# Patient Record
Sex: Female | Born: 1955 | Race: White | Hispanic: No | Marital: Married | Smoking: Never smoker
Health system: Southern US, Community
[De-identification: ages and names within clinical notes are randomized; demographics above are authoritative.]

## PROBLEM LIST (undated history)

## (undated) DIAGNOSIS — K900 Celiac disease: Secondary | ICD-10-CM

## (undated) DIAGNOSIS — E079 Disorder of thyroid, unspecified: Secondary | ICD-10-CM

## (undated) DIAGNOSIS — D693 Immune thrombocytopenic purpura: Secondary | ICD-10-CM

## (undated) DIAGNOSIS — M810 Age-related osteoporosis without current pathological fracture: Secondary | ICD-10-CM

## (undated) HISTORY — PX: THYROIDECTOMY: SHX17

## (undated) HISTORY — PX: TUBAL LIGATION: SHX77

## (undated) HISTORY — DX: Celiac disease: K90.0

## (undated) HISTORY — DX: Age-related osteoporosis without current pathological fracture: M81.0

## (undated) HISTORY — PX: TONSILLECTOMY: SUR1361

## (undated) HISTORY — PX: FOOT SURGERY: SHX648

## (undated) HISTORY — DX: Immune thrombocytopenic purpura: D69.3

## (undated) HISTORY — PX: APPENDECTOMY: SHX54

## (undated) HISTORY — DX: Disorder of thyroid, unspecified: E07.9

---

## 1997-06-02 ENCOUNTER — Ambulatory Visit (HOSPITAL_COMMUNITY): Admission: RE | Admit: 1997-06-02 | Discharge: 1997-06-02 | Payer: Self-pay | Admitting: Obstetrics and Gynecology

## 1998-05-03 ENCOUNTER — Encounter: Payer: Self-pay | Admitting: Emergency Medicine

## 1998-05-03 ENCOUNTER — Inpatient Hospital Stay (HOSPITAL_COMMUNITY): Admission: EM | Admit: 1998-05-03 | Discharge: 1998-05-04 | Payer: Self-pay | Admitting: Emergency Medicine

## 1998-06-07 ENCOUNTER — Encounter: Payer: Self-pay | Admitting: Obstetrics and Gynecology

## 1998-06-07 ENCOUNTER — Ambulatory Visit (HOSPITAL_COMMUNITY): Admission: RE | Admit: 1998-06-07 | Discharge: 1998-06-07 | Payer: Self-pay | Admitting: Obstetrics and Gynecology

## 1999-07-19 ENCOUNTER — Encounter: Payer: Self-pay | Admitting: Obstetrics and Gynecology

## 1999-07-19 ENCOUNTER — Ambulatory Visit (HOSPITAL_COMMUNITY): Admission: RE | Admit: 1999-07-19 | Discharge: 1999-07-19 | Payer: Self-pay | Admitting: Obstetrics and Gynecology

## 1999-07-20 ENCOUNTER — Encounter (INDEPENDENT_AMBULATORY_CARE_PROVIDER_SITE_OTHER): Payer: Self-pay | Admitting: Specialist

## 1999-07-20 ENCOUNTER — Ambulatory Visit (HOSPITAL_COMMUNITY): Admission: RE | Admit: 1999-07-20 | Discharge: 1999-07-20 | Payer: Self-pay | Admitting: Gastroenterology

## 1999-08-04 ENCOUNTER — Other Ambulatory Visit: Admission: RE | Admit: 1999-08-04 | Discharge: 1999-08-04 | Payer: Self-pay | Admitting: Obstetrics and Gynecology

## 2000-04-24 HISTORY — PX: ABDOMINAL HYSTERECTOMY: SHX81

## 2000-08-03 ENCOUNTER — Other Ambulatory Visit: Admission: RE | Admit: 2000-08-03 | Discharge: 2000-08-03 | Payer: Self-pay | Admitting: Obstetrics and Gynecology

## 2000-10-02 ENCOUNTER — Encounter: Payer: Self-pay | Admitting: Obstetrics and Gynecology

## 2000-10-02 ENCOUNTER — Ambulatory Visit (HOSPITAL_COMMUNITY): Admission: RE | Admit: 2000-10-02 | Discharge: 2000-10-02 | Payer: Self-pay | Admitting: Obstetrics and Gynecology

## 2000-10-11 ENCOUNTER — Encounter: Payer: Self-pay | Admitting: Obstetrics and Gynecology

## 2000-10-18 ENCOUNTER — Encounter (INDEPENDENT_AMBULATORY_CARE_PROVIDER_SITE_OTHER): Payer: Self-pay | Admitting: Specialist

## 2000-10-18 ENCOUNTER — Inpatient Hospital Stay (HOSPITAL_COMMUNITY): Admission: RE | Admit: 2000-10-18 | Discharge: 2000-10-21 | Payer: Self-pay | Admitting: Obstetrics and Gynecology

## 2000-12-15 ENCOUNTER — Emergency Department (HOSPITAL_COMMUNITY): Admission: EM | Admit: 2000-12-15 | Discharge: 2000-12-15 | Payer: Self-pay | Admitting: Internal Medicine

## 2000-12-15 ENCOUNTER — Encounter: Payer: Self-pay | Admitting: Internal Medicine

## 2001-10-16 ENCOUNTER — Other Ambulatory Visit: Admission: RE | Admit: 2001-10-16 | Discharge: 2001-10-16 | Payer: Self-pay | Admitting: Obstetrics and Gynecology

## 2001-11-19 ENCOUNTER — Encounter: Payer: Self-pay | Admitting: Obstetrics and Gynecology

## 2001-11-19 ENCOUNTER — Ambulatory Visit (HOSPITAL_COMMUNITY): Admission: RE | Admit: 2001-11-19 | Discharge: 2001-11-19 | Payer: Self-pay | Admitting: Obstetrics and Gynecology

## 2002-10-06 ENCOUNTER — Encounter: Admission: RE | Admit: 2002-10-06 | Discharge: 2002-10-06 | Payer: Self-pay | Admitting: Obstetrics and Gynecology

## 2002-10-06 ENCOUNTER — Encounter: Payer: Self-pay | Admitting: Obstetrics and Gynecology

## 2003-03-02 ENCOUNTER — Other Ambulatory Visit: Admission: RE | Admit: 2003-03-02 | Discharge: 2003-03-02 | Payer: Self-pay | Admitting: Obstetrics and Gynecology

## 2004-02-12 ENCOUNTER — Encounter: Admission: RE | Admit: 2004-02-12 | Discharge: 2004-02-12 | Payer: Self-pay | Admitting: Obstetrics and Gynecology

## 2004-02-15 ENCOUNTER — Encounter: Admission: RE | Admit: 2004-02-15 | Discharge: 2004-02-15 | Payer: Self-pay | Admitting: Endocrinology

## 2004-03-14 ENCOUNTER — Other Ambulatory Visit: Admission: RE | Admit: 2004-03-14 | Discharge: 2004-03-14 | Payer: Self-pay | Admitting: Obstetrics and Gynecology

## 2004-03-19 ENCOUNTER — Encounter: Admission: RE | Admit: 2004-03-19 | Discharge: 2004-03-19 | Payer: Self-pay | Admitting: Endocrinology

## 2004-03-23 ENCOUNTER — Encounter: Admission: RE | Admit: 2004-03-23 | Discharge: 2004-03-23 | Payer: Self-pay | Admitting: Endocrinology

## 2004-05-28 ENCOUNTER — Encounter: Admission: RE | Admit: 2004-05-28 | Discharge: 2004-05-28 | Payer: Self-pay | Admitting: Orthopaedic Surgery

## 2005-03-14 ENCOUNTER — Encounter: Admission: RE | Admit: 2005-03-14 | Discharge: 2005-03-14 | Payer: Self-pay | Admitting: Obstetrics and Gynecology

## 2005-04-27 ENCOUNTER — Encounter: Admission: RE | Admit: 2005-04-27 | Discharge: 2005-04-27 | Payer: Self-pay | Admitting: Orthopaedic Surgery

## 2005-07-18 ENCOUNTER — Other Ambulatory Visit: Admission: RE | Admit: 2005-07-18 | Discharge: 2005-07-18 | Payer: Self-pay | Admitting: Obstetrics and Gynecology

## 2005-07-19 ENCOUNTER — Ambulatory Visit: Payer: Self-pay | Admitting: Internal Medicine

## 2005-07-31 LAB — CBC WITH DIFFERENTIAL/PLATELET
Eosinophils Absolute: 0 10*3/uL (ref 0.0–0.5)
LYMPH%: 26.1 % (ref 14.0–48.0)
MCV: 87.9 fL (ref 81.0–101.0)
MONO%: 9.3 % (ref 0.0–13.0)
NEUT#: 4.2 10*3/uL (ref 1.5–6.5)
Platelets: 22 10*3/uL — ABNORMAL LOW (ref 145–400)
RBC: 4.43 10*6/uL (ref 3.70–5.32)

## 2005-07-31 LAB — LACTATE DEHYDROGENASE: LDH: 125 U/L (ref 94–250)

## 2005-08-03 LAB — CBC WITH DIFFERENTIAL/PLATELET
Basophils Absolute: 0.1 10*3/uL (ref 0.0–0.1)
Eosinophils Absolute: 0 10*3/uL (ref 0.0–0.5)
HGB: 13 g/dL (ref 11.6–15.9)
MONO#: 0.6 10*3/uL (ref 0.1–0.9)
NEUT#: 6.1 10*3/uL (ref 1.5–6.5)
RDW: 13.1 % (ref 11.3–14.5)
lymph#: 2.2 10*3/uL (ref 0.9–3.3)

## 2005-08-04 LAB — COMPREHENSIVE METABOLIC PANEL
AST: 17 U/L (ref 0–37)
Albumin: 5 g/dL (ref 3.5–5.2)
BUN: 15 mg/dL (ref 6–23)
Calcium: 9.4 mg/dL (ref 8.4–10.5)
Chloride: 101 mEq/L (ref 96–112)
Glucose, Bld: 110 mg/dL — ABNORMAL HIGH (ref 70–99)
Potassium: 4 mEq/L (ref 3.5–5.3)

## 2005-08-04 LAB — PROTHROMBIN TIME
INR: 1 (ref 0.0–1.5)
Prothrombin Time: 13.1 seconds (ref 11.6–15.2)

## 2005-08-10 LAB — CBC WITH DIFFERENTIAL/PLATELET
EOS%: 0.1 % (ref 0.0–7.0)
LYMPH%: 31.7 % (ref 14.0–48.0)
MCH: 29.7 pg (ref 26.0–34.0)
MCV: 89.5 fL (ref 81.0–101.0)
MONO%: 9.6 % (ref 0.0–13.0)
Platelets: 192 10*3/uL (ref 145–400)
RBC: 4.34 10*6/uL (ref 3.70–5.32)
RDW: 12.8 % (ref 11.3–14.5)

## 2005-08-10 LAB — LACTATE DEHYDROGENASE: LDH: 128 U/L (ref 94–250)

## 2005-08-15 LAB — CBC WITH DIFFERENTIAL/PLATELET
HCT: 38.5 % (ref 34.8–46.6)
MCH: 29.7 pg (ref 26.0–34.0)
MCHC: 33.6 g/dL (ref 32.0–36.0)
MONO#: 1.1 10*3/uL — ABNORMAL HIGH (ref 0.1–0.9)
MONO%: 8.8 % (ref 0.0–13.0)
Platelets: 175 10*3/uL (ref 145–400)
RBC: 4.35 10*6/uL (ref 3.70–5.32)
WBC: 12.6 10*3/uL — ABNORMAL HIGH (ref 3.9–10.0)
lymph#: 3.2 10*3/uL (ref 0.9–3.3)

## 2005-08-17 LAB — CBC WITH DIFFERENTIAL/PLATELET
BASO%: 0.1 % (ref 0.0–2.0)
EOS%: 0 % (ref 0.0–7.0)
HCT: 38.3 % (ref 34.8–46.6)
LYMPH%: 15.4 % (ref 14.0–48.0)
MCH: 29.7 pg (ref 26.0–34.0)
MCHC: 33.8 g/dL (ref 32.0–36.0)
MCV: 87.9 fL (ref 81.0–101.0)
MONO%: 6.8 % (ref 0.0–13.0)
NEUT%: 77.7 % — ABNORMAL HIGH (ref 39.6–76.8)
Platelets: 187 10*3/uL (ref 145–400)
lymph#: 1.7 10*3/uL (ref 0.9–3.3)

## 2005-08-25 LAB — CBC WITH DIFFERENTIAL/PLATELET
EOS%: 0.1 % (ref 0.0–7.0)
Eosinophils Absolute: 0 10*3/uL (ref 0.0–0.5)
MCV: 87.2 fL (ref 81.0–101.0)
MONO%: 3.2 % (ref 0.0–13.0)
NEUT#: 6.9 10*3/uL — ABNORMAL HIGH (ref 1.5–6.5)
RBC: 4.34 10*6/uL (ref 3.70–5.32)
RDW: 11.9 % (ref 11.3–14.5)

## 2005-08-25 LAB — CHCC SMEAR

## 2005-09-06 ENCOUNTER — Ambulatory Visit: Payer: Self-pay | Admitting: Oncology

## 2005-09-08 LAB — CBC WITH DIFFERENTIAL/PLATELET
Basophils Absolute: 0 10*3/uL (ref 0.0–0.1)
Eosinophils Absolute: 0 10*3/uL (ref 0.0–0.5)
HGB: 13.4 g/dL (ref 11.6–15.9)
LYMPH%: 12 % — ABNORMAL LOW (ref 14.0–48.0)
MCV: 88.1 fL (ref 81.0–101.0)
MONO%: 2.8 % (ref 0.0–13.0)
NEUT#: 6.8 10*3/uL — ABNORMAL HIGH (ref 1.5–6.5)
Platelets: 220 10*3/uL (ref 145–400)
RDW: 13 % (ref 11.3–14.5)

## 2005-09-22 LAB — CBC WITH DIFFERENTIAL/PLATELET
BASO%: 0.6 % (ref 0.0–2.0)
EOS%: 0 % (ref 0.0–7.0)
HCT: 37.5 % (ref 34.8–46.6)
LYMPH%: 22.8 % (ref 14.0–48.0)
MCH: 29.6 pg (ref 26.0–34.0)
MCHC: 33.5 g/dL (ref 32.0–36.0)
MONO%: 6.6 % (ref 0.0–13.0)
NEUT%: 70 % (ref 39.6–76.8)
Platelets: 210 10*3/uL (ref 145–400)
RBC: 4.24 10*6/uL (ref 3.70–5.32)

## 2005-10-06 LAB — CBC WITH DIFFERENTIAL/PLATELET
BASO%: 0.5 % (ref 0.0–2.0)
EOS%: 0.4 % (ref 0.0–7.0)
HCT: 37.3 % (ref 34.8–46.6)
MCH: 29.8 pg (ref 26.0–34.0)
MCHC: 33.8 g/dL (ref 32.0–36.0)
NEUT%: 60.5 % (ref 39.6–76.8)
RBC: 4.24 10*6/uL (ref 3.70–5.32)
RDW: 14.1 % (ref 11.3–14.5)
lymph#: 1.9 10*3/uL (ref 0.9–3.3)

## 2005-10-27 ENCOUNTER — Ambulatory Visit: Payer: Self-pay | Admitting: Oncology

## 2005-10-27 LAB — T4, FREE: Free T4: 1.27 ng/dL (ref 0.89–1.80)

## 2005-10-27 LAB — CBC WITH DIFFERENTIAL/PLATELET
BASO%: 0.3 % (ref 0.0–2.0)
Eosinophils Absolute: 0 10*3/uL (ref 0.0–0.5)
LYMPH%: 29.3 % (ref 14.0–48.0)
MCHC: 34 g/dL (ref 32.0–36.0)
MCV: 88.7 fL (ref 81.0–101.0)
MONO%: 8.3 % (ref 0.0–13.0)
Platelets: 194 10*3/uL (ref 145–400)
RBC: 4.13 10*6/uL (ref 3.70–5.32)

## 2005-10-27 LAB — TSH: TSH: 1.175 u[IU]/mL (ref 0.350–5.500)

## 2005-11-17 LAB — CBC WITH DIFFERENTIAL/PLATELET
Eosinophils Absolute: 0.1 10*3/uL (ref 0.0–0.5)
HCT: 37.8 % (ref 34.8–46.6)
LYMPH%: 30.7 % (ref 14.0–48.0)
MCHC: 34.3 g/dL (ref 32.0–36.0)
MCV: 88.3 fL (ref 81.0–101.0)
MONO#: 0.5 10*3/uL (ref 0.1–0.9)
MONO%: 8.4 % (ref 0.0–13.0)
NEUT#: 3.6 10*3/uL (ref 1.5–6.5)
NEUT%: 59.2 % (ref 39.6–76.8)
Platelets: 224 10*3/uL (ref 145–400)
WBC: 6.2 10*3/uL (ref 3.9–10.0)

## 2005-12-08 ENCOUNTER — Ambulatory Visit: Payer: Self-pay | Admitting: Oncology

## 2005-12-15 LAB — CBC WITH DIFFERENTIAL/PLATELET
Basophils Absolute: 0 10*3/uL (ref 0.0–0.1)
Eosinophils Absolute: 0 10*3/uL (ref 0.0–0.5)
HGB: 12.6 g/dL (ref 11.6–15.9)
MCV: 88.8 fL (ref 81.0–101.0)
MONO%: 9 % (ref 0.0–13.0)
NEUT#: 2.9 10*3/uL (ref 1.5–6.5)
RDW: 13.4 % (ref 11.3–14.5)
lymph#: 1.4 10*3/uL (ref 0.9–3.3)

## 2006-01-12 LAB — CBC WITH DIFFERENTIAL/PLATELET
Basophils Absolute: 0 10*3/uL (ref 0.0–0.1)
Eosinophils Absolute: 0 10*3/uL (ref 0.0–0.5)
HGB: 12.4 g/dL (ref 11.6–15.9)
LYMPH%: 36.2 % (ref 14.0–48.0)
MCV: 88.7 fL (ref 81.0–101.0)
MONO%: 8.2 % (ref 0.0–13.0)
NEUT#: 2.3 10*3/uL (ref 1.5–6.5)
NEUT%: 54.2 % (ref 39.6–76.8)
Platelets: 210 10*3/uL (ref 145–400)

## 2006-02-07 ENCOUNTER — Ambulatory Visit: Payer: Self-pay | Admitting: Oncology

## 2006-03-09 LAB — CBC WITH DIFFERENTIAL/PLATELET
Basophils Absolute: 0 10*3/uL (ref 0.0–0.1)
EOS%: 1 % (ref 0.0–7.0)
HGB: 12.9 g/dL (ref 11.6–15.9)
MCH: 30.7 pg (ref 26.0–34.0)
MONO#: 0.4 10*3/uL (ref 0.1–0.9)
NEUT#: 2.8 10*3/uL (ref 1.5–6.5)
RDW: 13.2 % (ref 11.3–14.5)
WBC: 5.2 10*3/uL (ref 3.9–10.0)
lymph#: 1.9 10*3/uL (ref 0.9–3.3)

## 2006-03-16 ENCOUNTER — Encounter: Admission: RE | Admit: 2006-03-16 | Discharge: 2006-03-16 | Payer: Self-pay | Admitting: Obstetrics and Gynecology

## 2006-03-22 ENCOUNTER — Encounter: Admission: RE | Admit: 2006-03-22 | Discharge: 2006-03-22 | Payer: Self-pay | Admitting: Obstetrics and Gynecology

## 2006-04-15 ENCOUNTER — Ambulatory Visit: Payer: Self-pay | Admitting: Oncology

## 2006-04-20 LAB — CBC WITH DIFFERENTIAL/PLATELET
BASO%: 0.3 % (ref 0.0–2.0)
MCHC: 33.8 g/dL (ref 32.0–36.0)
MONO#: 0.4 10*3/uL (ref 0.1–0.9)
NEUT#: 3 10*3/uL (ref 1.5–6.5)
RBC: 4.14 10*6/uL (ref 3.70–5.32)
WBC: 5.1 10*3/uL (ref 3.9–10.0)
lymph#: 1.6 10*3/uL (ref 0.9–3.3)

## 2006-06-06 ENCOUNTER — Ambulatory Visit: Payer: Self-pay | Admitting: Oncology

## 2006-06-08 LAB — CBC WITH DIFFERENTIAL/PLATELET
Basophils Absolute: 0 10*3/uL (ref 0.0–0.1)
Eosinophils Absolute: 0 10*3/uL (ref 0.0–0.5)
HGB: 13.3 g/dL (ref 11.6–15.9)
LYMPH%: 30 % (ref 14.0–48.0)
MCV: 88.8 fL (ref 81.0–101.0)
MONO%: 8.7 % (ref 0.0–13.0)
NEUT#: 3.2 10*3/uL (ref 1.5–6.5)
NEUT%: 60.2 % (ref 39.6–76.8)
Platelets: 232 10*3/uL (ref 145–400)
RBC: 4.25 10*6/uL (ref 3.70–5.32)

## 2006-07-26 ENCOUNTER — Other Ambulatory Visit: Admission: RE | Admit: 2006-07-26 | Discharge: 2006-07-26 | Payer: Self-pay | Admitting: Obstetrics and Gynecology

## 2006-09-03 ENCOUNTER — Encounter: Admission: RE | Admit: 2006-09-03 | Discharge: 2006-09-03 | Payer: Self-pay | Admitting: Obstetrics and Gynecology

## 2006-09-05 ENCOUNTER — Ambulatory Visit: Payer: Self-pay | Admitting: Oncology

## 2006-09-07 LAB — CBC WITH DIFFERENTIAL/PLATELET
BASO%: 0.2 % (ref 0.0–2.0)
Basophils Absolute: 0 10*3/uL (ref 0.0–0.1)
EOS%: 0.9 % (ref 0.0–7.0)
Eosinophils Absolute: 0 10*3/uL (ref 0.0–0.5)
HCT: 35.8 % (ref 34.8–46.6)
HGB: 12.6 g/dL (ref 11.6–15.9)
LYMPH%: 34.1 % (ref 14.0–48.0)
MCH: 30.5 pg (ref 26.0–34.0)
MCHC: 35.1 g/dL (ref 32.0–36.0)
MCV: 86.9 fL (ref 81.0–101.0)
MONO#: 0.5 10*3/uL (ref 0.1–0.9)
MONO%: 10.9 % (ref 0.0–13.0)
NEUT#: 2.4 10*3/uL (ref 1.5–6.5)
NEUT%: 53.9 % (ref 39.6–76.8)
Platelets: 205 10*3/uL (ref 145–400)
RBC: 4.11 10*6/uL (ref 3.70–5.32)
RDW: 13.2 % (ref 11.3–14.5)
WBC: 4.5 10*3/uL (ref 3.9–10.0)
lymph#: 1.5 10*3/uL (ref 0.9–3.3)

## 2006-12-05 ENCOUNTER — Ambulatory Visit: Payer: Self-pay | Admitting: Oncology

## 2006-12-07 LAB — CBC WITH DIFFERENTIAL/PLATELET
BASO%: 0.2 % (ref 0.0–2.0)
Basophils Absolute: 0 10*3/uL (ref 0.0–0.1)
EOS%: 0.3 % (ref 0.0–7.0)
Eosinophils Absolute: 0 10*3/uL (ref 0.0–0.5)
HCT: 37 % (ref 34.8–46.6)
HGB: 12.8 g/dL (ref 11.6–15.9)
LYMPH%: 27.9 % (ref 14.0–48.0)
MCH: 30.5 pg (ref 26.0–34.0)
MCHC: 34.6 g/dL (ref 32.0–36.0)
MCV: 88.1 fL (ref 81.0–101.0)
MONO#: 0.5 10*3/uL (ref 0.1–0.9)
MONO%: 8.9 % (ref 0.0–13.0)
NEUT#: 3.4 10*3/uL (ref 1.5–6.5)
NEUT%: 62.7 % (ref 39.6–76.8)
Platelets: 228 10*3/uL (ref 145–400)
RBC: 4.2 10*6/uL (ref 3.70–5.32)
RDW: 13 % (ref 11.3–14.5)
WBC: 5.4 10*3/uL (ref 3.9–10.0)
lymph#: 1.5 10*3/uL (ref 0.9–3.3)

## 2007-01-01 ENCOUNTER — Ambulatory Visit: Payer: Self-pay | Admitting: Gastroenterology

## 2007-01-01 LAB — CONVERTED CEMR LAB
Ferritin: 11.5 ng/mL (ref 10.0–291.0)
Folate: >20 ng/mL
Iron: 120 ug/dL (ref 42–145)
Saturation Ratios: 28.9 % (ref 20.0–50.0)
Transferrin: 296.6 mg/dL (ref >=212.0)
Vitamin B-12: 511 pg/mL (ref 211–911)

## 2007-01-10 ENCOUNTER — Encounter: Payer: Self-pay | Admitting: Gastroenterology

## 2007-01-21 DIAGNOSIS — K900 Celiac disease: Secondary | ICD-10-CM | POA: Insufficient documentation

## 2007-03-05 ENCOUNTER — Ambulatory Visit: Payer: Self-pay | Admitting: Oncology

## 2007-03-07 ENCOUNTER — Ambulatory Visit: Payer: Self-pay | Admitting: Gastroenterology

## 2007-03-07 LAB — CBC WITH DIFFERENTIAL/PLATELET
BASO%: 0.3 % (ref 0.0–2.0)
HGB: 12.8 g/dL (ref 11.6–15.9)
LYMPH%: 27.6 % (ref 14.0–48.0)
MCH: 30.7 pg (ref 26.0–34.0)
MCHC: 35 g/dL (ref 32.0–36.0)
MONO#: 0.5 10*3/uL (ref 0.1–0.9)
MONO%: 9.3 % (ref 0.0–13.0)
NEUT#: 3.1 10*3/uL (ref 1.5–6.5)
NEUT%: 61.7 % (ref 39.6–76.8)
RBC: 4.16 10*6/uL (ref 3.70–5.32)

## 2007-03-18 ENCOUNTER — Encounter: Payer: Self-pay | Admitting: Gastroenterology

## 2007-03-18 ENCOUNTER — Ambulatory Visit: Payer: Self-pay | Admitting: Gastroenterology

## 2007-04-05 ENCOUNTER — Ambulatory Visit: Payer: Self-pay | Admitting: Gastroenterology

## 2007-04-08 ENCOUNTER — Encounter: Admission: RE | Admit: 2007-04-08 | Discharge: 2007-04-08 | Payer: Self-pay | Admitting: Obstetrics and Gynecology

## 2007-04-19 DIAGNOSIS — M810 Age-related osteoporosis without current pathological fracture: Secondary | ICD-10-CM | POA: Insufficient documentation

## 2007-04-19 DIAGNOSIS — D693 Immune thrombocytopenic purpura: Secondary | ICD-10-CM | POA: Insufficient documentation

## 2007-04-19 DIAGNOSIS — K449 Diaphragmatic hernia without obstruction or gangrene: Secondary | ICD-10-CM | POA: Insufficient documentation

## 2007-04-19 DIAGNOSIS — Z8679 Personal history of other diseases of the circulatory system: Secondary | ICD-10-CM | POA: Insufficient documentation

## 2007-09-06 ENCOUNTER — Other Ambulatory Visit: Admission: RE | Admit: 2007-09-06 | Discharge: 2007-09-06 | Payer: Self-pay | Admitting: Obstetrics and Gynecology

## 2007-10-10 ENCOUNTER — Ambulatory Visit: Payer: Self-pay | Admitting: Oncology

## 2007-10-14 ENCOUNTER — Encounter: Payer: Self-pay | Admitting: Gastroenterology

## 2007-10-14 LAB — CBC WITH DIFFERENTIAL/PLATELET
BASO%: 0.2 % (ref 0.0–2.0)
LYMPH%: 29.9 % (ref 14.0–48.0)
MCHC: 34.4 g/dL (ref 32.0–36.0)
MCV: 88 fL (ref 81.0–101.0)
MONO#: 0.5 10*3/uL (ref 0.1–0.9)
MONO%: 9 % (ref 0.0–13.0)
Platelets: 228 10*3/uL (ref 145–400)
RBC: 4.34 10*6/uL (ref 3.70–5.32)
RDW: 13 % (ref 11.3–14.5)
WBC: 5.7 10*3/uL (ref 3.9–10.0)

## 2008-01-15 ENCOUNTER — Encounter: Admission: RE | Admit: 2008-01-15 | Discharge: 2008-01-15 | Payer: Self-pay | Admitting: Family Medicine

## 2008-04-14 ENCOUNTER — Ambulatory Visit: Payer: Self-pay | Admitting: Oncology

## 2008-04-14 LAB — CBC WITH DIFFERENTIAL/PLATELET
EOS%: 1.8 % (ref 0.0–7.0)
Eosinophils Absolute: 0.1 10*3/uL (ref 0.0–0.5)
LYMPH%: 27.9 % (ref 14.0–48.0)
MCH: 30.7 pg (ref 26.0–34.0)
MCV: 90.1 fL (ref 81.0–101.0)
MONO%: 8.2 % (ref 0.0–13.0)
NEUT#: 3.5 10*3/uL (ref 1.5–6.5)
Platelets: 238 10*3/uL (ref 145–400)
RBC: 4.26 10*6/uL (ref 3.70–5.32)
RDW: 12.9 % (ref 11.3–14.5)

## 2008-04-21 ENCOUNTER — Encounter: Admission: RE | Admit: 2008-04-21 | Discharge: 2008-04-21 | Payer: Self-pay | Admitting: Family Medicine

## 2008-04-22 ENCOUNTER — Ambulatory Visit: Payer: Self-pay | Admitting: Obstetrics and Gynecology

## 2008-10-09 ENCOUNTER — Ambulatory Visit: Payer: Self-pay | Admitting: Oncology

## 2008-10-13 ENCOUNTER — Encounter: Payer: Self-pay | Admitting: Gastroenterology

## 2008-10-13 LAB — CBC WITH DIFFERENTIAL/PLATELET
Basophils Absolute: 0 10*3/uL (ref 0.0–0.1)
EOS%: 0.6 % (ref 0.0–7.0)
Eosinophils Absolute: 0 10*3/uL (ref 0.0–0.5)
HCT: 39.9 % (ref 34.8–46.6)
HGB: 14.1 g/dL (ref 11.6–15.9)
LYMPH%: 27.7 % (ref 14.0–49.7)
MCH: 31.5 pg (ref 25.1–34.0)
MCV: 89.3 fL (ref 79.5–101.0)
MONO%: 7.7 % (ref 0.0–14.0)
NEUT#: 3.2 10*3/uL (ref 1.5–6.5)
NEUT%: 63.7 % (ref 38.4–76.8)
Platelets: 203 10*3/uL (ref 145–400)

## 2008-12-01 ENCOUNTER — Encounter: Payer: Self-pay | Admitting: Obstetrics and Gynecology

## 2008-12-01 ENCOUNTER — Ambulatory Visit: Payer: Self-pay | Admitting: Obstetrics and Gynecology

## 2008-12-01 ENCOUNTER — Other Ambulatory Visit: Admission: RE | Admit: 2008-12-01 | Discharge: 2008-12-01 | Payer: Self-pay | Admitting: Obstetrics and Gynecology

## 2009-01-26 ENCOUNTER — Encounter: Admission: RE | Admit: 2009-01-26 | Discharge: 2009-04-19 | Payer: Self-pay | Admitting: Specialist

## 2009-04-05 ENCOUNTER — Ambulatory Visit: Payer: Self-pay | Admitting: Oncology

## 2009-04-07 LAB — CBC WITH DIFFERENTIAL/PLATELET
Basophils Absolute: 0 10*3/uL (ref 0.0–0.1)
EOS%: 0.3 % (ref 0.0–7.0)
Eosinophils Absolute: 0 10*3/uL (ref 0.0–0.5)
HCT: 39.6 % (ref 34.8–46.6)
HGB: 13.3 g/dL (ref 11.6–15.9)
MCH: 30.9 pg (ref 25.1–34.0)
NEUT#: 4.3 10*3/uL (ref 1.5–6.5)
NEUT%: 65.2 % (ref 38.4–76.8)
RDW: 13.3 % (ref 11.2–14.5)
lymph#: 1.8 10*3/uL (ref 0.9–3.3)

## 2009-05-07 ENCOUNTER — Ambulatory Visit: Payer: Self-pay | Admitting: Gastroenterology

## 2009-05-07 DIAGNOSIS — K219 Gastro-esophageal reflux disease without esophagitis: Secondary | ICD-10-CM | POA: Insufficient documentation

## 2009-05-13 ENCOUNTER — Encounter: Admission: RE | Admit: 2009-05-13 | Discharge: 2009-05-13 | Payer: Self-pay | Admitting: Obstetrics and Gynecology

## 2009-07-12 ENCOUNTER — Telehealth (INDEPENDENT_AMBULATORY_CARE_PROVIDER_SITE_OTHER): Payer: Self-pay | Admitting: *Deleted

## 2010-03-04 ENCOUNTER — Ambulatory Visit: Payer: Self-pay | Admitting: Oncology

## 2010-03-08 ENCOUNTER — Encounter: Payer: Self-pay | Admitting: Gastroenterology

## 2010-03-08 LAB — CBC WITH DIFFERENTIAL/PLATELET
BASO%: 0.1 % (ref 0.0–2.0)
Basophils Absolute: 0 10*3/uL (ref 0.0–0.1)
EOS%: 2.2 % (ref 0.0–7.0)
Eosinophils Absolute: 0.1 10*3/uL (ref 0.0–0.5)
HCT: 38.8 % (ref 34.8–46.6)
HGB: 13.3 g/dL (ref 11.6–15.9)
LYMPH%: 33.3 % (ref 14.0–49.7)
MCH: 30.9 pg (ref 25.1–34.0)
MCHC: 34.2 g/dL (ref 31.5–36.0)
MCV: 90.4 fL (ref 79.5–101.0)
MONO#: 0.4 10*3/uL (ref 0.1–0.9)
MONO%: 9.3 % (ref 0.0–14.0)
NEUT#: 2.6 10*3/uL (ref 1.5–6.5)
NEUT%: 55.1 % (ref 38.4–76.8)
Platelets: 201 10*3/uL (ref 145–400)
RBC: 4.29 10*6/uL (ref 3.70–5.45)
RDW: 13.1 % (ref 11.2–14.5)
WBC: 4.8 10*3/uL (ref 3.9–10.3)
lymph#: 1.6 10*3/uL (ref 0.9–3.3)

## 2010-04-21 ENCOUNTER — Telehealth (INDEPENDENT_AMBULATORY_CARE_PROVIDER_SITE_OTHER): Payer: Self-pay | Admitting: *Deleted

## 2010-05-19 ENCOUNTER — Encounter
Admission: RE | Admit: 2010-05-19 | Discharge: 2010-05-19 | Payer: Self-pay | Source: Home / Self Care | Attending: Gastroenterology | Admitting: Gastroenterology

## 2010-05-25 ENCOUNTER — Encounter: Payer: Self-pay | Admitting: Gastroenterology

## 2010-05-26 NOTE — Assessment & Plan Note (Signed)
Summary: stomach problems    History of Present Illness Primary GI MD: Sheryn Bison MD FACP FAGA Chief Complaint: cough, acid reflux worse when lying down, constantly clearing throat, belching is now better since watching what she eats and drinks History of Present Illness:   This patient is a 55 year old Caucasian female with ITP in remission, chronic thyroid dysfunction, and celiac disease who now has new onset acid reflux with throat clearing, coughing, hoarseness, regurgitation and burning substernal chest pain alleviated by elevation of the head of her bed and dietary adjustment after going on a strict medical and reflect regime self-imposed. She's had 2 negative endoscopies in the past. She has no dysphasia, anorexia, weight loss, or hepatobiliary complaints. She did feel rather strict gluten-free diet. She denies any history of Raynaud's phenomenon. She denies lower gastrointestinal problems. She is on Synthroid and estradiol.   GI Review of Systems    Reports acid reflux, belching, and  chest pain.      Denies abdominal pain, bloating, dysphagia with liquids, dysphagia with solids, heartburn, loss of appetite, nausea, vomiting, vomiting blood, weight loss, and  weight gain.        Denies anal fissure, black tarry stools, change in bowel habit, constipation, diarrhea, diverticulosis, fecal incontinence, heme positive stool, hemorrhoids, irritable bowel syndrome, jaundice, light color stool, liver problems, rectal bleeding, and  rectal pain. Preventive Screening-Counseling & Management  Alcohol-Tobacco     Smoking Status: never      Drug Use:  no.      Current Medications (verified): 1)  Synthroid 50 Mcg Tabs (Levothyroxine Sodium) .Marland Kitchen.. 1 Every Other Day 2)  Synthroid 75 Mcg Tabs (Levothyroxine Sodium) .Marland Kitchen.. 1 Every Other Day 3)  Estradiol 0.5 Mg Tabs (Estradiol) .Marland Kitchen.. 1 Tablet By Mouth Once Daily  Allergies (verified): No Known Drug Allergies  Past History:  Past medical,  surgical, family and social histories (including risk factors) reviewed for relevance to current acute and chronic problems.  Past Medical History: Reviewed history from 04/19/2007 and no changes required. 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 thyroidectomy 1975 Total Abdominal Hysterectomy 2002 appendectomy 2002 tubal ligation  Family History: Reviewed history and no changes required. No FH of Colon Cancer: Melanoma-sister and father  Social History: Reviewed history and no changes required. Occupation: Retired Patient has never smoked.  Alcohol Use - yes  occasionally Illicit Drug Use - no Daily Caffeine Use 1-2 cups per day Smoking Status:  never Drug Use:  no  Review of Systems       The patient complains of arthritis/joint pain, cough, and voice change.  The patient denies allergy/sinus, anemia, anxiety-new, back pain, blood in urine, breast changes/lumps, change in vision, confusion, coughing up blood, depression-new, fainting, fatigue, fever, headaches-new, hearing problems, heart murmur, heart rhythm changes, itching, menstrual pain, muscle pains/cramps, night sweats, nosebleeds, pregnancy symptoms, shortness of breath, skin rash, sleeping problems, sore throat, swelling of feet/legs, swollen lymph glands, thirst - excessive , urination - excessive , urination changes/pain, urine leakage, and vision changes.    Vital Signs:  Patient profile:   55 year old female Height:      51.5 inches Weight:      114.50 pounds BMI:     30.46 Pulse rate:   64 / minute Pulse rhythm:   regular BP sitting:   118 / 80  (left arm)  Vitals Entered By: Milford Cage NCMA (May 07, 2009 11:28 AM)  Physical Exam  General:  Well developed, well nourished,  no acute distress.healthy appearing.   Head:  Normocephalic and atraumatic. Eyes:  PERRLA, no icterus.exam deferred to patient's ophthalmologist.   Mouth:  No deformity or lesions, dentition normal. Neck:  Supple; no masses or thyromegaly. Lungs:  Clear throughout to auscultation. Heart:  Regular rate and rhythm; no murmurs, rubs,  or bruits. Abdomen:  Soft, nontender and nondistended. No masses, hepatosplenomegaly or hernias noted. Normal bowel sounds. Msk:  Symmetrical with no gross deformities. Normal posture. Extremities:  No clubbing, cyanosis, edema or deformities noted. Neurologic:  Alert and  oriented x4;  grossly normal neurologically. Cervical Nodes:  No significant cervical adenopathy. Inguinal Nodes:  No significant inguinal adenopathy. Psych:  Alert and cooperative. Normal mood and affect.   Impression & Recommendations:  Problem # 1:  CELIAC DISEASE (ICD-579.0) Assessment Improved Continue gluten-free diet.  Problem # 2:  GERD (ICD-530.81) Assessment: Improved I have reviewed reflux regime with her and have advised p.r.n. Pepcid AC. She saw her patient education  video on GERD and its management. I do not think she needs repeat endoscopy at this time.  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 Mancel Bale as needed  Patient Instructions: 1)  Copy sent to : Dr. Corrin Parker and Dr. Mancel Bale 2)  Please continue current medications.  3)  Avoid foods high in acid content ( tomatoes, citrus juices, spicy foods) . Avoid eating within 3 to 4 hours of lying down or before exercising. Do not over eat; try smaller more frequent meals. Elevate head of bed four inches when sleeping.  4)  Reflux Esophagitis Hernia handout given.  5)  Please schedule a follow-up appointment as needed.

## 2010-05-26 NOTE — Progress Notes (Signed)
   Recieved ROI through Mail this AM..I copied Records for pt. Mailed out to her Today [facility]  July 12, 2009 9:40 AM

## 2010-05-26 NOTE — Letter (Signed)
Summary: [facility]  [facility]   Imported By: Lennie Odor 03/18/2010 14:03:56  _____________________________________________________________________  External Attachment:    Type:   Image     Comment:   External Document

## 2010-05-26 NOTE — Progress Notes (Signed)
  Phone Note Other Incoming   Request: Send information Summary of Call: Received a [employer] completed release. Patient is requesting for her records to be sent to Dr. Ewing Schlein. Request forwarded to [facility].  ( 2000 to 2011)      Appended Document:  Patient's appointment is 04/28/2010. Received a call from Lurena Joiner stating such.

## 2010-06-28 ENCOUNTER — Other Ambulatory Visit: Payer: Self-pay | Admitting: Family Medicine

## 2010-06-28 DIAGNOSIS — Z1231 Encounter for screening mammogram for malignant neoplasm of breast: Secondary | ICD-10-CM

## 2010-07-14 ENCOUNTER — Ambulatory Visit: Payer: Self-pay

## 2010-07-14 ENCOUNTER — Ambulatory Visit
Admission: RE | Admit: 2010-07-14 | Discharge: 2010-07-14 | Disposition: A | Discharge status: Home / Self Care | Payer: Commercial Indemnity | Source: Ambulatory Visit | Attending: Family Medicine | Admitting: Family Medicine

## 2010-07-14 DIAGNOSIS — Z1231 Encounter for screening mammogram for malignant neoplasm of breast: Secondary | ICD-10-CM

## 2010-09-06 NOTE — Assessment & Plan Note (Signed)
[facility]                         GASTROENTEROLOGY OFFICE NOTE   NAME:Diana [name], Diana [name]                         MRN:          [id number]  DATE:01/01/2007                            DOB:          03-Oct-1955    New patient evaluation - self referred.   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   She has had fresh blood on the toilet paper for the last several months  and has frequent bowel movements each day.  She denies significant  abdominal pain or any upper GI complaints.  I last did colonoscopy on  her because of crampy lower abdominal pain in 2001.  At that time she  had erosion in her cecum that was biopsied and showed focal active  colitis, most likely secondary to NSAID damage. There was no evidence of  ileitis on ileal biopsies.  She had a normal small-bowel biopsy.  Inflammatory bowel disease markers at that time were negative.   The patient has no specific food intolerances and follows a regular  diet.  She does suffer from rather severe osteoporosis and has chronic  thyroid dysfunction and had a thyroidectomy in 1975.  She has also had a  hysterectomy, tubal ligation, an appendectomy.  She was diagnosed with  ITP in 2007 and has been on prednisone per Dr. Mancel Bale but has  been off of this prednisone for the last three months.   MEDICATIONS:  1. Synthroid 75 mcg a day.  2. Estradiol 0.5 mg a day.  3. I believe calcium daily.  4. Multivitamin with vitamin D daily.   FAMILY HISTORY:  Remarkable for diabetes in both her mother and her  father.  She is of [place] descent.   SOCIAL HISTORY:  She is married and lives with her husband.  She has a  [employer] and works as a Furniture conservator/restorer in business.  She does not  smoke or abuse ethanol.   REVIEW OF SYSTEMS:  Positive for vague arthralgias and excessive  urination.  She denies any cardiovascular or  pulmonary complaints at  this time.  She denies recurrent skin rashes, mouth sores, visual  difficulty or unexplained weight loss.   PHYSICAL EXAMINATION:  GENERAL APPEARANCE:  She is a healthy appearing  white female appearing her stated age.  VITAL SIGNS:  She is 5 feet 2 inches tall and weighs 116 pounds.  Blood  pressure 120/72 and pulse 72 and regular.  HEENT:  I could not appreciate stigmata of chronic liver disease or  thyromegaly.  CHEST:  Clear.  CARDIOVASCULAR:  She appeared to be in a regular rhythm without  significant murmurs, rubs, or gallops.  ABDOMEN:  Could not appreciate abdominal distention, organomegaly,  masses or tenderness.  Bowel sounds were normal.  EXTREMITIES:  Unremarkable without edema, phlebitis or swollen joints.  RECTAL:  Inspection of rectum showed no hemorrhoids, fissures or  fistulae.  Rectal exam showed no masses or tenderness with formed stool  present, guaiac negative.   ASSESSMENT:  1. Intermittent rectal bleeding in a 55 year old white  female without      any significant family history of colon cancer. She had a negative      exam some seven years ago.  She most likely has internal      hemorrhoidal bleeding but we need to exclude colonic polyposis.  2. Rule out celiac disease with various complaints and history of      severe osteoporosis.  3. Chronic thyroid dysfunction and osteoporosis followed by Dr.      Dagoberto Ligas.  4. Status post hysterectomy and gynecologic follow-up with Dr. Dario Guardian.  5. Recently diagnosed idiopathic thrombocytopenia followed by Dr. Mancel Bale.  6. Strong family history of diabetes.   RECOMMENDATIONS:  1. Follow-up colonoscopy exam.  2. Check celiac serology screen.  3. Local anal care as needed.  4. Other medications as per multiple physicians.     Vania Rea. Jarold Motto, MD, Caleen Essex, FAGA  Electronically Signed    DRP/MedQ  DD: 01/01/2007  DT: 01/02/2007  Job #: [id number]   cc:   Alfonse Alpers. Dagoberto Ligas, M.D.  Dario Guardian, M.D.  Leighton Roach. Truett Perna, M.D.

## 2010-09-06 NOTE — Assessment & Plan Note (Signed)
 [facility]                         GASTROENTEROLOGY OFFICE NOTE   NAME:[name], [name]                         MRN:          [id number]  DATE:04/05/2007                            DOB:          03/16/1956    Diana [name]'s rectal bleeding has gone away since her diarrhea has ceased. She  was found to have celiac disease with a rather markedly positive  antibody profile with a positive antiendomysial antibody and a tissue  transglutaminase of 26, normal less than 4. She also had a genotype DQ2,  which gives her 10 times the risk of the general population for celiac  disease. She was placed on a gluten-free diet and has done well, and  actually had a normal small bowel biopsy on 03/18/2007, and also a  normal colonoscopy.   As per my previous notes, [name] has suffered from rather severe  osteoporosis and she has borderline iron deficiency with a serum  ferritin of 11 nanograms percent. I have suggested to her that she take  a multivitamin with iron.   Her normal small bowel biopsy is not surprising since she has been on a  gluten-free diet for several months and I have encouraged her to do this  long term. She is on thyroid replacement medication and is being treated  for osteoporosis by Dr. Dagoberto Ligas. I will send him a copy of this note and  we will continue to follow her yearly or on a p.r.n. GI basis as needed.  The patient also a history of ITP and is followed by Dr. Truett Perna.     Vania Rea. Jarold Motto, MD, Caleen Essex, FAGA  Electronically Signed    DRP/MedQ  DD: 04/05/2007  DT: 04/06/2007  Job #: [id number]   cc:   Alfonse Alpers. Dagoberto Ligas, M.D.  Dario Guardian, M.D.  Leighton Roach. Truett Perna, M.D.

## 2010-09-09 NOTE — Discharge Summary (Signed)
[facility]  Patient:    Diana [name], Diana [name]                    MRN: [id number] Adm. Date:  [id number] Disc. Date: 10/21/00 Attending:  Sharon Mt                           Discharge Summary  HISTORY OF PRESENT ILLNESS:  The patient is a 55 year old female who was admitted to the hospital with symptomatic fibroids for definitive surgery.  HOSPITAL COURSE:  On the day of admission, she was taken to operating room. The patient had some pelvic adhesive disease involving her left ovary along with an ovarian cyst, as well as leiomyomata uteri.  As result of the above, she underwent total abdominal hysterectomy and left salpingo-oophorectomy. Examination of her appendix revealed an apparent fecalith.  As a result of that, she also underwent appendectomy at the same time.  Postoperatively, her course was just slightly slowed because of the appendectomy and a small ileus. By the third postoperative day, she was passing gas, voiding well, and tolerating liquids.  She was therefore discharged.  DISCHARGE DIET:  Soft to advance.  DISCHARGE WOUND CARE:  Routine.  DISCHARGE ACTIVITY:  Ambulatory.  LABORATORY DATA:  The final pathology report is not in the chart at the time of dictation.  FOLLOW-UP:  The patient is to return to our office in three weeks for a follow-up.  DISCHARGE MEDICATIONS:  Extra Strength Tylenol for pain relief.  DISCHARGE DIAGNOSES: 1. Symptomatic fibroids. 2. Benign left ovarian cyst. 3. Pelvic adhesive disease. 4. Abnormal appendix probably secondary to fecalith.  OPERATIONS: 1. Total abdominal hysterectomy. 2. Left salpingo-oophorectomy. 3. Appendectomy.  CONDITION ON DISCHARGE:  Improved. DD:  10/21/00 TD:  10/21/00 Job: [id number]

## 2010-09-09 NOTE — Op Note (Signed)
[facility]  Patient:    Diana [name], Diana [name]                    MRN: [id number] Proc. Date: 10/18/00 Adm. Date:  [id number] Attending:  Sharon Mt                           Operative Report  PREOPERATIVE DIAGNOSIS:  Left adnexal mass, leiomyomatata uteri with menorrhagia.  POSTOPERATIVE DIAGNOSIS:  Benign cyst of left ovary, pelvic adhesive disease of left adnexa, leiomyomatata uteri with menorrhagia, fecalith of the appendix.  OPERATION:  Total abdominal hysterectomy, left salpingo-oophorectomy, appendectomy.  SURGEON:  Daniel L. Eda Paschal, M.D.  FIRST ASSISTANT:  Raynald Kemp, M.D.  FINDINGS:  Patients uterus was enlarged to approximately 12 week size by multiple myoma.  The right ovary and fallopian tube were entirely unremarkable without any pathology.  The left ovary was adherent to the broad ligament and seemed to be mostly replaced by a 3 cm ovarian cyst, which was distorting significantly the anatomy of the left ovary and also was adhering the adnexa not only to the broad ligament, but to the serosa of the uterus.  Pelvic peritoneum was free of any disease.  When the ileocecal junction was identified, there was a hard area in the middle part of the appendix that appeared to be most consistent with a fecalith.  PROCEDURE:  After adequate general endotracheal anesthesia, the patient was placed in a supine position, prepped and draped in the usual sterile manner. A Foley catheter was inserted in the patients bladder.  A Pfannenstiel incision was made, the fascia was opened transversely, the peritoneum was entered vertically.  Subcutaneous bleeders were clamped and bovied as encountered.  When the peritoneal cavity was opened, the above findings were noted.  Peritoneal washings were obtained.  The round ligaments were bovied and cut.  The vesicouterine fold of peritoneum was sharply dissected free. The utero-ovarian ligament and  round ligament on the right was clamped, cut and doubly suture ligated with #1 chromic catgut.  Because the right adnexa was normal and because the left adnexa was adherent to the broad ligament to the uterus after it was freed up, it was felt better to remove the left ovary and tube and get a frozen on it.  The retroperitoneal space was opened.  The ureter was identified, deepened through pelvic ligament on the left was clamped, cut and doubly suture ligated with #1 chromic catgut and then the adnexa was sent to pathology for tissue diagnosis.  The bladder flap was sharply dissected free.  The uterine arteries were clamped, cut and doubly suture ligated with #1 chromic catgut.  The parametrium was taken down with successive bites by clamping, cutting and suture ligating with #1 chromic catgut.  Cervicovaginal junction was identified and with sharp dissection trimmed around and the uterus and cervix were sent to pathology for tissue diagnosis.  Angle sutures were placed in the angles of the vagina incorporating uterosacral ligaments and parametrium for good vault support. The cuff was closed with figure-of-eights of 0 Vicryl.  At this point, frozen section on the left adnexa came back benign.  Attention was next turned to the appendix.  It was not normal.  It was removed by ligating the mesoappendiceal vessels with 0 Vicryl, treating the appendiceal stump with double ligature of 0 Vicryl and then burying the appendix with a 3-0 silk suture.  Estimated blood loss for the  entire procedure was 200 cc, but none replaced.  The patient tolerated the procedure well.  All cul-de-sac fluid was removed.  The peritoneum was closed with a running 0 Vicryl, fascia was closed with two running 0 Vicryls, skin was closed with staples.  As noted above, blood loss was 200 cc.  The patient left the operating room in satisfactory condition draining clear urine from her Foley catheter. DD:  10/18/00 TD:   10/18/00 Job: [id number]

## 2010-09-09 NOTE — H&P (Signed)
[facility]  Patient:    Diana [name], Diana [name]                           MRN: [id number] Attending:  Rande Brunt. Eda Paschal, M.D.                         History and Physical  CHIEF COMPLAINT:  Menorrhagia with myomas.  HISTORY OF PRESENT ILLNESS:  Patient is a 55 year old gravida 1, para 0, Ab1, who has been suffering over the past several years with severe menorrhagia and dysmenorrhea secondary to a large myoma.  On ultrasound, the myoma is approximately 7 cm, it is submucous in nature but it is not confined to the intrauterine cavity.  She has tried oral contraceptives, she has tried nonsteroidal anti-inflammatory drugs and continues to have the above, including some back pain which also may be related.  As a result of the persistence of the above symptoms, she now enters the hospital for total abdominal hysterectomy.  On her last ultrasound, she had a small 1.4-cm cyst on her left ovary and a small 1.3-cm cyst on her right ovary and they will be evaluated and excised if appropriate.  She has given me permission to remove one or both ovaries but only for significant disease, as she would like to avoid starting menopause.  PAST MEDICAL HISTORY:  T&A as a child, thyroidectomy in 1975, tubal ligation in the year 69 or 1984.  PRESENT MEDICATIONS:  Miacalcin for osteoporosis, Synthroid for thyroid replacement.  ALLERGIES:  She is allergic to no drugs.  FAMILY HISTORY:  Both her parents have had cardiovascular disease; her father is diabetic; her mother, father and sisters are all hypertensive.  SOCIAL HISTORY:  She drinks alcohol occasionally.  She is a nonsmoker and does not use caffeine.  She exercises by walking.  REVIEW OF SYSTEMS:  HEENT:  Negative.  CARDIAC:  Negative.  GI:  Negative. GU:  Negative.  NEUROMUSCULAR:  Negative.  ENDOCRINE:  Negative.  PHYSICAL EXAMINATION:  GENERAL:  Patient is a well-developed and well-nourished female in no  acute distress.  VITAL SIGNS:  Blood pressure is 124/84.  Pulse is 80 and regular. Respirations are 16 and unlabored.  She is afebrile.  HEENT:  All within normal limits.  NECK:  Supple.  Trachea in the midline.  ______ .  LUNGS:  Clear to P&A.  HEART:  No thrills, heaves or murmurs.  BREASTS:  No masses.  ABDOMEN:  Soft without guarding, rebound or masses.  PELVIC:  External and vagina are within normal limits.  Cervix is clean.  Pap smear shows no atypia.  Uterus is at least three times normal size with a 7-cm myoma.  Adnexa are not palpably enlarged.  RECTAL:  Negative.  EXTREMITIES:  Within normal limits.  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  PLAN:  Total abdominal hysterectomy. DD:  10/18/00 TD:  10/18/00 Job: [id number]

## 2011-08-24 ENCOUNTER — Other Ambulatory Visit: Payer: Self-pay | Admitting: Family Medicine

## 2011-08-24 DIAGNOSIS — Z1231 Encounter for screening mammogram for malignant neoplasm of breast: Secondary | ICD-10-CM

## 2011-10-10 ENCOUNTER — Ambulatory Visit
Admission: RE | Admit: 2011-10-10 | Discharge: 2011-10-10 | Disposition: A | Discharge status: Home / Self Care | Payer: Commercial Indemnity | Source: Ambulatory Visit | Attending: Family Medicine | Admitting: Family Medicine

## 2011-10-10 DIAGNOSIS — Z1231 Encounter for screening mammogram for malignant neoplasm of breast: Secondary | ICD-10-CM

## 2012-06-17 ENCOUNTER — Other Ambulatory Visit: Payer: Self-pay | Admitting: Otolaryngology

## 2012-06-17 DIAGNOSIS — H9209 Otalgia, unspecified ear: Secondary | ICD-10-CM

## 2013-01-03 ENCOUNTER — Ambulatory Visit: Payer: PRIVATE HEALTH INSURANCE

## 2013-01-10 ENCOUNTER — Ambulatory Visit: Payer: PRIVATE HEALTH INSURANCE | Attending: Orthopedic Surgery

## 2013-01-10 DIAGNOSIS — IMO0001 Reserved for inherently not codable concepts without codable children: Secondary | ICD-10-CM | POA: Insufficient documentation

## 2013-01-10 DIAGNOSIS — R262 Difficulty in walking, not elsewhere classified: Secondary | ICD-10-CM | POA: Insufficient documentation

## 2013-01-13 ENCOUNTER — Ambulatory Visit: Payer: PRIVATE HEALTH INSURANCE

## 2013-01-13 ENCOUNTER — Ambulatory Visit: Payer: Commercial Indemnity | Admitting: Sports Medicine

## 2013-01-15 ENCOUNTER — Ambulatory Visit: Payer: PRIVATE HEALTH INSURANCE

## 2013-01-20 ENCOUNTER — Ambulatory Visit: Payer: PRIVATE HEALTH INSURANCE

## 2013-01-22 ENCOUNTER — Ambulatory Visit: Payer: PRIVATE HEALTH INSURANCE | Attending: Orthopedic Surgery

## 2013-01-22 DIAGNOSIS — R262 Difficulty in walking, not elsewhere classified: Secondary | ICD-10-CM | POA: Insufficient documentation

## 2013-01-22 DIAGNOSIS — IMO0001 Reserved for inherently not codable concepts without codable children: Secondary | ICD-10-CM | POA: Insufficient documentation

## 2013-01-27 ENCOUNTER — Ambulatory Visit: Payer: Commercial Indemnity

## 2013-01-27 ENCOUNTER — Ambulatory Visit: Payer: PRIVATE HEALTH INSURANCE

## 2013-01-29 ENCOUNTER — Ambulatory Visit: Payer: Commercial Indemnity

## 2013-01-29 ENCOUNTER — Ambulatory Visit: Payer: PRIVATE HEALTH INSURANCE

## 2013-02-03 ENCOUNTER — Ambulatory Visit: Payer: PRIVATE HEALTH INSURANCE

## 2013-02-04 ENCOUNTER — Ambulatory Visit (INDEPENDENT_AMBULATORY_CARE_PROVIDER_SITE_OTHER): Payer: PRIVATE HEALTH INSURANCE | Admitting: Sports Medicine

## 2013-02-04 ENCOUNTER — Encounter: Payer: Self-pay | Admitting: Sports Medicine

## 2013-02-04 VITALS — BP 115/75 | Ht 61.0 in | Wt 118.0 lb

## 2013-02-04 DIAGNOSIS — G90529 Complex regional pain syndrome I of unspecified lower limb: Secondary | ICD-10-CM

## 2013-02-04 DIAGNOSIS — G90521 Complex regional pain syndrome I of right lower limb: Secondary | ICD-10-CM | POA: Insufficient documentation

## 2013-02-04 MED ORDER — AMITRIPTYLINE HCL 10 MG PO TABS: ORAL_TABLET | ORAL | Status: DC

## 2013-02-04 NOTE — Assessment & Plan Note (Signed)
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

## 2013-02-04 NOTE — Progress Notes (Signed)
Patient ID: Diana [name], female   DOB: 06/04/1955, 57 y.o.   MRN: [id number]  Patient who had surgery on May 20 for a complicated bunion by Dr. Victorino Dike She was in a cast for 2 weeks For 5 weeks she was in a shoe but was nonweightbearing on a scooter  By the time she came out of the nonweightbearing. 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 She started physical therapy at [facility] orthopedics end of this for 10 weeks but did not regain full ankle motion and now has significant difficulty walking  Prior to the surgery she was walking a good deal and had even hypermobile left ankle She has had a lot of joint mobility However several years ago she had a left frozen shoulder She also has a history of hyperthyroidism in the 1970s and after treatment has been maintained on Synthroid  She does have osteoporosis for which she takes estrogen and calcitonin as well as calcium and vitamin D  She comes for evaluation  Physical examination  Pleasant female in no acute distress  She has 0 of dorsiflexion the right ankle 25 of plantar flexion 20 of inversion and plantar flexion and about 15 of eversion There is some nonspecific generalized swelling No color change of the skin  Comparisons the left ankle she has at least twice a motion inversion plantar flexion and eversion of the right side In addition she has 35 of dorsiflexion  Area of bunion surgery along the right first MTP joint appears to have healed very well

## 2013-02-04 NOTE — Patient Instructions (Signed)
Try compression sleeve on ankle for activity and for 1 hour afterwards- ok to wear as needed at other times  Please do not sleep in ankle sleeve  Do range of motion exercises while soaking ankle in warm water  Start amitriptyline 10 mg - 1 tab at bedtime for 2 weeks, then increased to 2 tablets at bedtime  Please follow up in 1 month  Thank you for seeing [place] today!

## 2013-02-05 ENCOUNTER — Ambulatory Visit: Payer: PRIVATE HEALTH INSURANCE

## 2013-02-10 ENCOUNTER — Ambulatory Visit: Payer: PRIVATE HEALTH INSURANCE

## 2013-02-14 ENCOUNTER — Ambulatory Visit: Payer: PRIVATE HEALTH INSURANCE

## 2013-02-17 ENCOUNTER — Ambulatory Visit: Payer: PRIVATE HEALTH INSURANCE

## 2013-02-19 ENCOUNTER — Ambulatory Visit: Payer: PRIVATE HEALTH INSURANCE

## 2013-02-24 ENCOUNTER — Ambulatory Visit: Payer: PRIVATE HEALTH INSURANCE | Attending: Orthopedic Surgery

## 2013-02-24 DIAGNOSIS — R262 Difficulty in walking, not elsewhere classified: Secondary | ICD-10-CM | POA: Insufficient documentation

## 2013-02-24 DIAGNOSIS — IMO0001 Reserved for inherently not codable concepts without codable children: Secondary | ICD-10-CM | POA: Insufficient documentation

## 2013-02-27 ENCOUNTER — Other Ambulatory Visit: Payer: Self-pay

## 2013-02-27 ENCOUNTER — Ambulatory Visit: Payer: PRIVATE HEALTH INSURANCE

## 2013-03-03 ENCOUNTER — Ambulatory Visit: Payer: PRIVATE HEALTH INSURANCE

## 2013-03-06 ENCOUNTER — Ambulatory Visit: Payer: PRIVATE HEALTH INSURANCE | Admitting: Sports Medicine

## 2013-03-06 ENCOUNTER — Ambulatory Visit: Payer: PRIVATE HEALTH INSURANCE

## 2013-03-12 ENCOUNTER — Ambulatory Visit: Payer: PRIVATE HEALTH INSURANCE

## 2013-03-18 ENCOUNTER — Ambulatory Visit: Payer: PRIVATE HEALTH INSURANCE

## 2013-03-26 ENCOUNTER — Ambulatory Visit: Payer: PRIVATE HEALTH INSURANCE | Attending: Orthopedic Surgery

## 2013-03-26 DIAGNOSIS — IMO0001 Reserved for inherently not codable concepts without codable children: Secondary | ICD-10-CM | POA: Insufficient documentation

## 2013-03-26 DIAGNOSIS — R262 Difficulty in walking, not elsewhere classified: Secondary | ICD-10-CM | POA: Insufficient documentation

## 2013-03-27 ENCOUNTER — Ambulatory Visit: Payer: PRIVATE HEALTH INSURANCE | Admitting: Sports Medicine

## 2013-04-02 ENCOUNTER — Ambulatory Visit: Payer: PRIVATE HEALTH INSURANCE

## 2013-06-17 ENCOUNTER — Other Ambulatory Visit: Payer: Self-pay

## 2013-06-17 DIAGNOSIS — Z1231 Encounter for screening mammogram for malignant neoplasm of breast: Secondary | ICD-10-CM

## 2013-06-30 ENCOUNTER — Ambulatory Visit
Admission: RE | Admit: 2013-06-30 | Discharge: 2013-06-30 | Disposition: A | Discharge status: Home / Self Care | Payer: PRIVATE HEALTH INSURANCE | Source: Ambulatory Visit

## 2013-06-30 DIAGNOSIS — Z1231 Encounter for screening mammogram for malignant neoplasm of breast: Secondary | ICD-10-CM

## 2013-07-24 ENCOUNTER — Other Ambulatory Visit (HOSPITAL_COMMUNITY)
Admission: RE | Admit: 2013-07-24 | Discharge: 2013-07-24 | Disposition: A | Discharge status: Home / Self Care | Payer: 59 | Source: Ambulatory Visit | Attending: Gynecology | Admitting: Gynecology

## 2013-07-24 ENCOUNTER — Ambulatory Visit (INDEPENDENT_AMBULATORY_CARE_PROVIDER_SITE_OTHER): Payer: 59 | Admitting: Gynecology

## 2013-07-24 ENCOUNTER — Encounter: Payer: Self-pay | Admitting: Gynecology

## 2013-07-24 ENCOUNTER — Encounter: Payer: Self-pay | Admitting: Obstetrics and Gynecology

## 2013-07-24 VITALS — BP 120/74 | Ht 62.0 in | Wt 120.0 lb

## 2013-07-24 DIAGNOSIS — M949 Disorder of cartilage, unspecified: Secondary | ICD-10-CM

## 2013-07-24 DIAGNOSIS — Z01419 Encounter for gynecological examination (general) (routine) without abnormal findings: Secondary | ICD-10-CM

## 2013-07-24 DIAGNOSIS — M899 Disorder of bone, unspecified: Secondary | ICD-10-CM

## 2013-07-24 DIAGNOSIS — N952 Postmenopausal atrophic vaginitis: Secondary | ICD-10-CM

## 2013-07-24 DIAGNOSIS — M8580 Other specified disorders of bone density and structure, unspecified site: Secondary | ICD-10-CM

## 2013-07-24 DIAGNOSIS — Z124 Encounter for screening for malignant neoplasm of cervix: Secondary | ICD-10-CM | POA: Insufficient documentation

## 2013-07-24 MED ORDER — ESTRADIOL 0.5 MG PO TABS: 0.5000 mg | ORAL_TABLET | Freq: Every day | ORAL | Status: DC

## 2013-07-24 NOTE — Progress Notes (Signed)
Diana [name] 10/19/1955 [id number]        58 y.o.  G1P0010 for annual exam.  Former patient of Dr. Eda PaschalGottsegen. Several issues noted below.  Past medical history,surgical history, problem list, medications, allergies, family history and social history were all reviewed and documented in the EPIC chart.  ROS:  Performed and pertinent positives and negatives are included in the history, assessment and plan .  Exam: Kim assistant Filed Vitals:   07/24/13 1113  BP: 120/74  Height: 5\' 2"  (1.575 m)  Weight: 120 lb (54.432 kg)   General appearance  Normal Skin grossly normal Head/Neck normal with no cervical or supraclavicular adenopathy thyroid normal Lungs  clear Cardiac RR, without RMG Abdominal  soft, nontender, without masses, organomegaly or hernia Breasts  examined lying and sitting without masses, retractions, discharge or axillary adenopathy. Pelvic  Ext/BUS/vagina with generalized atrophic changes. Pap of cuff done   Adnexa  Without masses or tenderness    Anus and perineum  Normal   Rectovaginal  Normal sphincter tone without palpated masses or tenderness.    Assessment/Plan:  58 y.o. 201P0010 female for annual exam.   1. HRT. Status post TAH LSO for menorrhagia with adenomyosis.  Currently on estradiol 0.5 mg. Was started primarily for bone health. 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 Status post thyroidectomy on thyroid replacement now. Had trial of Fosamax but it dropped her platelet count which apparently is a known side effect of Fosamax. She is being followed for ITP and this lead to clinical bleeding. She is doing well on her estradiol but is worried about continuing long-term. Was started in the perimenopausal time frame early 6350s.  I reviewed the whole issue of HRT with her to include the WHI study with increased risk of stroke, heart attack, DVT and breast cancer. The ACOG and NAMS statements for lowest dose for the shortest period of  time reviewed. Transdermal versus oral first-pass effect benefit discussed. Alternatives for osteoporosis also reviewed to include Prolia. She reports her last bone density 2 years ago at Dr. Lucianne MussKumar office. Said it was stable from prior DEXA. At this point we both agree on continuing ERT and she is doing well on this and not switching to a patch. I refilled her estradiol 0.5 mg times one year. I do recommend repeating a DEXA next year a three-year interval for a recent followup and she agrees with this. 2. Pap smear 2010. Pap of cuff done today. No history of abnormal Pap smears previously. Options to stop screening altogether she is status post hysterectomy for benign indications versus less frequent screening intervals reviewed. We'll readdress on an annual basis. 3. Mammography 06/2013. Continue with annual mammography. SBE monthly reviewed. 4. Colonoscopy 2014. Repeat at their recommended interval. 5. Health maintenance. No blood work done as this is all done through her primary physician's office. Followup one year, sooner as needed.   Note: This document was prepared with digital dictation and possible smart phrase technology. Any transcriptional errors that result from this process are unintentional.   Dara LordsFONTAINE,Diana Kluttz P MD, 11:59 AM 07/24/2013

## 2013-07-24 NOTE — Patient Instructions (Signed)
Continue on estrogen replacement as we discussed. Followup in one year for annual exam.

## 2013-07-24 NOTE — Addendum Note (Signed)
Addended by: Dayna BarkerGARDNER, Aalyssa Elderkin K on: 07/24/2013 12:13 PM   Modules accepted: Orders

## 2014-02-23 ENCOUNTER — Encounter: Payer: Self-pay | Admitting: Gynecology

## 2014-05-08 ENCOUNTER — Encounter: Payer: Self-pay | Admitting: Gynecology

## 2014-05-08 ENCOUNTER — Ambulatory Visit (INDEPENDENT_AMBULATORY_CARE_PROVIDER_SITE_OTHER): Payer: 59 | Admitting: Gynecology

## 2014-05-08 DIAGNOSIS — N898 Other specified noninflammatory disorders of vagina: Secondary | ICD-10-CM

## 2014-05-08 DIAGNOSIS — L292 Pruritus vulvae: Secondary | ICD-10-CM

## 2014-05-08 LAB — WET PREP FOR TRICH, YEAST, CLUE
Clue Cells Wet Prep HPF POC: NONE SEEN
Trich, Wet Prep: NONE SEEN
Yeast Wet Prep HPF POC: NONE SEEN

## 2014-05-08 MED ORDER — TERCONAZOLE 0.8 % VA CREA: 1.0000 | TOPICAL_CREAM | Freq: Every day | VAGINAL | Status: DC

## 2014-05-08 NOTE — Progress Notes (Signed)
Diana [name] 06-Feb-1956 [id number]        59 y.o.  G1P0010 presents with several days of thick white cottage cheese discharge and vulvar pruritus. No odor or urinary symptoms such as dysuria or frequency or urgency.    Past medical history,surgical history, problem list, medications, allergies, family history and social history were all reviewed and documented in the EPIC chart.  Directed ROS with pertinent positives and negatives documented in the history of present illness/assessment and plan.  Exam: Kim assistant General appearance:  Normal Abdomen soft nontender without masses guarding rebound External BUS vagina with atrophic changes. The white discharge noted. Bimanual exam without masses or tenderness.  Assessment/Plan:  59 y.o. G1P0010 history and exam consistent with yeast vaginitis. Wet prep is unimpressive. Will cover with Terazol 3 day cream nightly 3 days. Follow up if symptoms persist, worsen or recur     Diana LordsFONTAINE,[name] MD, 12:50 PM 05/08/2014

## 2014-05-08 NOTE — Patient Instructions (Signed)
Use the vaginal cream intravaginally at bedtime for 3 nights. Follow up if your symptoms persist, worsen or recur.

## 2014-05-09 LAB — URINALYSIS W MICROSCOPIC + REFLEX CULTURE
Bacteria, UA: NONE SEEN
Bilirubin Urine: NEGATIVE
Casts: NONE SEEN
Crystals: NONE SEEN
Glucose, UA: NEGATIVE mg/dL
Hgb urine dipstick: NEGATIVE
KETONES UR: NEGATIVE mg/dL
Leukocytes, UA: NEGATIVE
Nitrite: NEGATIVE
PROTEIN: NEGATIVE mg/dL
Specific Gravity, Urine: 1.008 (ref 1.005–1.030)
Squamous Epithelial / LPF: NONE SEEN
Urobilinogen, UA: 0.2 mg/dL (ref 0.0–1.0)
pH: 5.5 (ref 5.0–8.0)

## 2014-05-11 ENCOUNTER — Telehealth: Payer: Self-pay | Admitting: *Deleted

## 2014-05-11 NOTE — Telephone Encounter (Signed)
Pt informed with u/a results on OV 05/08/14. Pt informed results are negative.

## 2014-05-18 ENCOUNTER — Telehealth: Payer: Self-pay | Admitting: *Deleted

## 2014-05-18 NOTE — Telephone Encounter (Signed)
She can finish whatever cream she has left. If symptoms persist call back.

## 2014-05-18 NOTE — Telephone Encounter (Signed)
Left the below on pt voicemail. 

## 2014-05-18 NOTE — Telephone Encounter (Signed)
Pt was prescribed Terazol 3 day cream nightly 3 days, completed full dose of Rx. Pt said she is still having white discharge only, no itching or irritation. Pt asked if she should use more cream? She still has some cream left.  Any recommendations?

## 2014-12-24 ENCOUNTER — Other Ambulatory Visit: Payer: Self-pay

## 2014-12-24 DIAGNOSIS — Z1231 Encounter for screening mammogram for malignant neoplasm of breast: Secondary | ICD-10-CM

## 2014-12-31 ENCOUNTER — Ambulatory Visit
Admission: RE | Admit: 2014-12-31 | Discharge: 2014-12-31 | Disposition: A | Discharge status: Home / Self Care | Payer: 59 | Source: Ambulatory Visit

## 2014-12-31 DIAGNOSIS — Z1231 Encounter for screening mammogram for malignant neoplasm of breast: Secondary | ICD-10-CM

## 2015-01-20 ENCOUNTER — Telehealth: Payer: Self-pay | Admitting: Podiatry

## 2015-01-20 NOTE — Telephone Encounter (Signed)
Called pt and i left a voicemail on her cell phone too r/s her appt

## 2015-01-22 ENCOUNTER — Encounter: Payer: Self-pay | Admitting: Podiatry

## 2015-01-22 ENCOUNTER — Ambulatory Visit (INDEPENDENT_AMBULATORY_CARE_PROVIDER_SITE_OTHER): Payer: 59 | Admitting: Podiatry

## 2015-01-22 DIAGNOSIS — M2010 Hallux valgus (acquired), unspecified foot: Secondary | ICD-10-CM

## 2015-01-22 DIAGNOSIS — M779 Enthesopathy, unspecified: Secondary | ICD-10-CM

## 2015-01-22 DIAGNOSIS — L84 Corns and callosities: Secondary | ICD-10-CM | POA: Diagnosis not present

## 2015-01-22 MED ORDER — TRIAMCINOLONE ACETONIDE 10 MG/ML IJ SUSP
10.0000 mg | Freq: Once | INTRAMUSCULAR | Status: AC
Start: 2015-01-22 — End: 2015-01-22
  Administered 2015-01-22: 10 mg

## 2015-01-22 NOTE — Progress Notes (Signed)
Subjective:     Patient ID: Diana [name], female   DOB: 1955-05-21, 59 y.o.   MRN: [id number]  HPI patient presents with pain underneath the left foot states it's been bothering her for a long time. Also has had bunion deformity fixed on the right which did not do well for her requiring plate fixation and has structural bunion deformity left that she lives with. She states it's very inflamed and sore on the left foot   Review of Systems  All other systems reviewed and are negative.      Objective:   Physical Exam  Constitutional: She is oriented to person, place, and time.  Cardiovascular: Intact distal pulses.   Musculoskeletal: Normal range of motion.  Neurological: She is oriented to person, place, and time.  Skin: Skin is warm.  Nursing note and vitals reviewed.  neurovascular status intact muscle strength adequate range of motion within normal limits with patient noted to have a area of inflammation around the fifth metatarsal head left with fluid buildup and keratotic tissue formation. Also is noted to have structural bunion deformity left and healed surgical procedure right with good alignment of the first metatarsal     Assessment:     Inflammatory plantarflexed lesion fifth metatarsal with capsular irritation and keratotic tissue that's painful when pressed    Plan:     H&P and condition reviewed with patient. I went ahead today and did a capsular injection fifth MPJ 3 mg dexamethasone Kenalog 5 mg Xylocaine and then after appropriate numbness debrided the lesion fully which was tolerated well

## 2015-01-22 NOTE — Progress Notes (Signed)
   Subjective:    Patient ID: Diana [name], female    DOB: Jan 22, 1956, 59 y.o.   MRN: [id number]  HPI Comments: "I have a callus"  Patient presents with: Callouses: Sub 5th MPJ left - tender, callused area since the Summer 2016, she has tried to trim-still sore when walking  Patient states that she did not want xrays taken     Review of Systems  Eyes: Positive for redness.  Allergic/Immunologic: Positive for food allergies.  All other systems reviewed and are negative.      Objective:   Physical Exam        Assessment & Plan:

## 2015-08-04 ENCOUNTER — Ambulatory Visit (INDEPENDENT_AMBULATORY_CARE_PROVIDER_SITE_OTHER): Payer: 59 | Admitting: Podiatry

## 2015-08-04 ENCOUNTER — Encounter: Payer: Self-pay | Admitting: Podiatry

## 2015-08-04 VITALS — BP 137/78 | HR 56 | Resp 16

## 2015-08-04 DIAGNOSIS — M2010 Hallux valgus (acquired), unspecified foot: Secondary | ICD-10-CM

## 2015-08-04 DIAGNOSIS — S92911A Unspecified fracture of right toe(s), initial encounter for closed fracture: Secondary | ICD-10-CM

## 2015-08-04 DIAGNOSIS — L84 Corns and callosities: Secondary | ICD-10-CM

## 2015-08-04 DIAGNOSIS — M779 Enthesopathy, unspecified: Secondary | ICD-10-CM | POA: Diagnosis not present

## 2015-08-04 MED ORDER — TRIAMCINOLONE ACETONIDE 10 MG/ML IJ SUSP
10.0000 mg | Freq: Once | INTRAMUSCULAR | Status: AC
  Administered 2015-08-04: 10 mg

## 2015-08-05 NOTE — Progress Notes (Signed)
Subjective:     Patient ID: Diana [name], female   DOB: Jun 05, 1955, 60 y.o.   MRN: [id number]  HPI patient presents stating I been getting a lot of pain underneath my right foot to wear my left foot had her and I need the same treatment and also I broke my big toe on my right and I know on getting need my bunion fixed on my left and I had my bunion fixed right 3 years ago but I've not been happy with   Review of Systems     Objective:   Physical Exam Neurovascular status intact muscle strength adequate range of motion within normal limits with patient noted to have a dressing on the right big toe which is stabilized against the second toe and a large keratotic lesion on the plantar aspect fifth metatarsal head right with fluid buildup and lesion formation with pain    Assessment:     Fracture of the right hallux with inflammatory capsulitis right fifth MPJ with keratotic lesion formation    Plan:     H&P and conditions reviewed with patient. We'll focus on the outside of the right foot today and that ultimately I like to get x-rays of her feet review the fracture and also structural bunion deformity left. Today I injected the right fifth MPJ plantar 3 mg dexamethasone Kenalog 5 mg Xylocaine and debrided the lesion fully and applied padding. Reappoint 2 weeks for x-rays and review of other conditions

## 2015-08-23 ENCOUNTER — Ambulatory Visit (INDEPENDENT_AMBULATORY_CARE_PROVIDER_SITE_OTHER): Payer: 59 | Admitting: Podiatry

## 2015-08-23 ENCOUNTER — Ambulatory Visit (INDEPENDENT_AMBULATORY_CARE_PROVIDER_SITE_OTHER): Payer: 59

## 2015-08-23 ENCOUNTER — Encounter: Payer: Self-pay | Admitting: Podiatry

## 2015-08-23 DIAGNOSIS — M779 Enthesopathy, unspecified: Secondary | ICD-10-CM

## 2015-08-23 DIAGNOSIS — S92911D Unspecified fracture of right toe(s), subsequent encounter for fracture with routine healing: Secondary | ICD-10-CM

## 2015-08-23 DIAGNOSIS — M2010 Hallux valgus (acquired), unspecified foot: Secondary | ICD-10-CM

## 2015-08-25 NOTE — Progress Notes (Signed)
Subjective:     Patient ID: Diana [name], female   DOB: 10/26/1955, 60 y.o.   MRN: [id number]  HPI patient states my foot is improving but I still have some swelling in my big toe and I'm concerned about the fracture in the digit   Review of Systems     Objective:   Physical Exam Neurovascular status intact muscle strength adequate with continued edema in the right hallux that's moderate in intensity with improvement of the fifth metatarsal right were previous treatment was undertaken    Assessment:     Fracture of the right hallux and also improvement of Taylor's bunion inflammatory capsulitis right fifth met    Plan:     H&P and x-rays reviewed. At this time I have recommended continued compression and stiff bottom shoes for the right foot and it should heal uneventfully and I explained that arthritis may eventually occur and it may require a more aggressive treatment plan  X-ray report indicated fracture through the joint of the right hallux that measures approximate 1 mm in width that should heal but may require fourth right arthritis and may require eventual surgery

## 2015-08-26 ENCOUNTER — Ambulatory Visit: Payer: 59 | Admitting: Podiatry

## 2015-09-27 ENCOUNTER — Encounter: Payer: Self-pay | Admitting: Podiatry

## 2015-09-27 ENCOUNTER — Ambulatory Visit (INDEPENDENT_AMBULATORY_CARE_PROVIDER_SITE_OTHER): Payer: 59

## 2015-09-27 ENCOUNTER — Ambulatory Visit (INDEPENDENT_AMBULATORY_CARE_PROVIDER_SITE_OTHER): Payer: 59 | Admitting: Podiatry

## 2015-09-27 VITALS — BP 110/62 | HR 62 | Resp 16

## 2015-09-27 DIAGNOSIS — S92911D Unspecified fracture of right toe(s), subsequent encounter for fracture with routine healing: Secondary | ICD-10-CM

## 2015-09-27 DIAGNOSIS — M21629 Bunionette of unspecified foot: Secondary | ICD-10-CM | POA: Diagnosis not present

## 2015-09-27 DIAGNOSIS — M779 Enthesopathy, unspecified: Secondary | ICD-10-CM

## 2015-09-27 NOTE — Progress Notes (Signed)
Subjective:     Patient ID: Diana [name], female   DOB: Apr 04, 1956, 60 y.o.   MRN: [id number]  HPI patient states that the right foot is still tender in the big toe joint and she feels like it pops at times   Review of Systems     Objective:   Physical Exam Neurovascular status intact muscle strength adequate with discomfort in the lateral side of the right hallux with relative range of motion loss first MPJ    Assessment:     Continued fracture of the right hallux    Plan:     Advised on physical therapy anti-inflammatories and utilization of wider-type shoes. I do not think this will be a long-term problem but needs to be monitored and I want her to bring her old x-rays and so I can compare them size of the joint to previous  X-rays indicate fracture of the base of the hallux right that seems to be healing well and is localized

## 2015-10-25 ENCOUNTER — Ambulatory Visit: Payer: 59 | Admitting: Podiatry

## 2016-07-26 ENCOUNTER — Other Ambulatory Visit: Payer: Self-pay | Admitting: Family Medicine

## 2016-07-26 DIAGNOSIS — Z1231 Encounter for screening mammogram for malignant neoplasm of breast: Secondary | ICD-10-CM

## 2016-08-15 ENCOUNTER — Ambulatory Visit
Admission: RE | Admit: 2016-08-15 | Discharge: 2016-08-15 | Disposition: A | Discharge status: Home / Self Care | Payer: 59 | Source: Ambulatory Visit | Attending: Family Medicine | Admitting: Family Medicine

## 2016-08-15 DIAGNOSIS — Z1231 Encounter for screening mammogram for malignant neoplasm of breast: Secondary | ICD-10-CM

## 2018-01-03 ENCOUNTER — Other Ambulatory Visit: Payer: Self-pay | Admitting: Family Medicine

## 2018-01-03 DIAGNOSIS — Z1231 Encounter for screening mammogram for malignant neoplasm of breast: Secondary | ICD-10-CM

## 2018-02-05 ENCOUNTER — Ambulatory Visit
Admission: RE | Admit: 2018-02-05 | Discharge: 2018-02-05 | Disposition: A | Discharge status: Home / Self Care | Payer: 59 | Source: Ambulatory Visit | Attending: Family Medicine | Admitting: Family Medicine

## 2018-02-05 DIAGNOSIS — Z1231 Encounter for screening mammogram for malignant neoplasm of breast: Secondary | ICD-10-CM

## 2018-06-25 ENCOUNTER — Ambulatory Visit (INDEPENDENT_AMBULATORY_CARE_PROVIDER_SITE_OTHER): Payer: Medicare Other | Admitting: Psychiatry

## 2018-06-25 DIAGNOSIS — F4323 Adjustment disorder with mixed anxiety and depressed mood: Secondary | ICD-10-CM

## 2018-06-25 DIAGNOSIS — Z638 Other specified problems related to primary support group: Secondary | ICD-10-CM | POA: Diagnosis not present

## 2018-06-25 DIAGNOSIS — F422 Mixed obsessional thoughts and acts: Secondary | ICD-10-CM

## 2018-06-25 NOTE — Progress Notes (Signed)
[facility] Initial Evaluation  Name: Diana [name] MRN: [id number] DOB: December 09, 1955 Date: 06/25/2018  PCP: Gweneth Dimitri, MD  Time spent: 60 min  Guardian/Payee: self  Paperwork requested:  No   Reason for Visit /Presenting Problem:  Chief Complaint  Patient presents with  . Family Problem  Here on transfer due to going on Medicare.   Struggling with morality of dealing with sister about a grudge, wants to stop obsessing.    Narrative/History of Present Illness Problem of PT and sister not speaking for over a year.  Grew up in an emotionally distant household, father alcoholic, sober with AA his last 20 years.  4 kids in the family (3 female), mother a Charity fundraiser, father travelled for work Research scientist (physical sciences)).  Mother primarily raised the kids, not a loving, caring person, always seemed angry, realized she was stressed.  Not raised with Christian values, though both parents were raised [place] and left the faith themselves.  Provided well, but no display of affection.  Father was more affectionate personally, but withdrawn when drinking.  Daddy's girl, great loss when he passed, of a sudden MI, at their home in [place], in [place].  Brought mother back home to [place].  Difficult for both having her live them 6 months, until mother relocated in her own space nearby.    Sister [name] is the other middle child, never got along well.  History of clashes every few years, unresolved but just passed.  For the decade mother lived here, repetitive disagreements about how to care for her.  At mother's death, [name] took things from the house without consulting.  One issue a missing bottle of Xanax when mother was hospitalized, the kind of responsibility PT can obsess about as an OCD sufferer.  [name] reassured her it would turn up; a long time later admitted she took it (below).  Pattern over the years of [name] doing a number of things on her own authority and dismissing PT's feelings.  Father was a  genealogy buff, PT inherited a book, precious to her, of genealogy research.  About a year ago, sister wanted to borrow it, and against her instincts PT let her.  A couple months ago wanted it back, sister denied ever seeing it and shut down the conversation.  As it was dear to PT, repeated attempts to call, text, all unreplied.  Last effort, offering to come help look, got blasted for wanting to "rifle through" her home.  Tried an olive branch, bringing a gift of fruit (a winter custom), blurted out how [name] has never treated her like a sister, and got cursed for 20 minutes.  Feeling wounded, and irritated, PT blurted out about the Xanax bottle, [name] said she in fact had it and had never taken any of the pills, then fetched it from storage and forced it into PT's hand.  By so doing, revealed both her impatience with probing and the facts that she had lied and had kept it, intact, apparently as some sort of memento.  Missing this at the moment, and irritated still, PT blurted out "You're the druggie", which was obviously not borne out by the facts, and which she regrets to this day.  Quandary how to proceed trying to restore some relationship with sister.  Clinically, discloses OCD hx including germ phobia and tendency to be overanalytical.  Stable over time, not particularly burdensome, but can still affect comfort and quality of living.  Married 39 yrs to [employer]").  No children.  Loves her nephews, [name]'s kids.  [name], the youngest, recently learned of the estrangement, supportive.  Mental Status Exam: Appearance:   Casual and Neat     Behavior:  Appropriate  Motor:  Normal  Speech/Language:   Clear and Coherent  Affect:  Appropriate  Mood:  concerned  Thought process:  normal  Thought content:    WNL  Sensory/Perceptual disturbances:    WNL  Orientation:  within normal limits  Attention:  Good  Concentration:  Good  Memory:  WNL  Fund of knowledge:   Good  Insight:    Good   Judgment:   Good  Impulse Control:  Good   Risk Assessment: Danger to Self: No Self-injurious Behavior: No Danger to Others: No Duty to Warn: no Physical Aggression / Violence: No  Access to Firearms a concern: No  Gang Involvement: No  Patient / guardian was educated about steps to take if suicide or homicide risk level increases between visits: yes While future psychiatric events cannot be accurately predicted, the patient does not currently require acute inpatient psychiatric care and does not currently meet [facility] involuntary commitment criteria.  Substance Abuse History: Current substance abuse: No     Past Psychiatric History:   Self-reported hx OCD.  Prior treatment not detailed.  Abuse History: No claims of abuse/trauma witnessed or experienced  Family History:  See above  Living situation:  Patient lives with their spouse  Sexual Orientation:   Straight  Relationship Status:  married   Lawyer; spouse friends  Surveyor, quantity Stress:  Not assessed  Income/Employment/Disability: Not assessed  Financial planner: Not assessed  Educational History: Education: Not assessed  Religion/Sprituality/World View:   Protestant  Any cultural differences that may affect / interfere with treatment:  not applicable   Recreation/Hobbies: Not assessed  Stressors: Marital or family conflict  Strengths:  Family, Friends and Church  Barriers:  none noted    Medical History/Surgical History:not reviewed Past Medical History:  Diagnosis Date  . Celiac disease   . ITP (idiopathic thrombocytopenic purpura)   . Osteoporosis   . Thyroid disease    Hypothyroid     Past Surgical History:  Procedure Laterality Date  . ABDOMINAL HYSTERECTOMY  2002   TAH LSO menorrhagia, adenomyosis  . APPENDECTOMY    . FOOT SURGERY    . THYROIDECTOMY    . TONSILLECTOMY    . TUBAL LIGATION      Medications (as listed in Epic): Current Outpatient Medications   Medication Sig Dispense Refill  . cetirizine (ZYRTEC) 10 MG tablet Take 10 mg by mouth daily.    Marland Kitchen estradiol (VIVELLE-DOT) 0.0375 MG/24HR Place 1 patch onto the skin 2 (two) times a week.    . levothyroxine (SYNTHROID, LEVOTHROID) 75 MCG tablet Take 75 mcg by mouth daily before breakfast. Pt takes 125 mcg but splits pill in half     No current facility-administered medications for this visit.     Allergies  Allergen Reactions  . Fosamax [Alendronate Sodium] Other (See Comments)    Decreased platelets  . Wheat Bran Other (See Comments)    Celiac    Diagnoses:    ICD-10-CM   1. Adjustment disorder with mixed anxiety and depressed mood F43.23   2. Mixed obsessional thoughts and acts F42.2   3. Relationship problem with family member Z63.8     Initial Therapy and Plan: Interpreted sister as quite likely having undiagnosed, untreated OCD herself, from indications of hoarding mother items with no use/value, and permission given  to see her sister as disordered.  Suggestion she does harbor FOO resentments, which PT is simply not granted the authority to address.  Acknowledged and validated regret over labelling sister unfairly.  In response to question, explained that PT most likely felt strong urge to show sister what it feels like to be brushed off or blamed unfairly, too much so to hold back from being unfair herself in a moment.  Discussed possibly getting sister a note that says she has given it some more thought and realizes she was wrong to go there, wish her well for seasonal and general reasons.    Intention to f/u 1 week, as able.     Robley Fries, PhD

## 2018-07-02 ENCOUNTER — Ambulatory Visit: Payer: Medicare Other | Admitting: Psychiatry

## 2018-07-08 ENCOUNTER — Ambulatory Visit: Payer: Medicare Other | Admitting: Psychiatry

## 2018-07-08 ENCOUNTER — Encounter: Payer: Self-pay | Admitting: Psychiatry

## 2018-07-08 NOTE — Progress Notes (Signed)
No-show/Short-notice cancellation note Marliss Czar, PhD, [facility]  Patient ID: Diana [name]     MRN: [id number]     Date: 07/08/2018   PT called short-notice to cancel afternoon appt, citing illness and/or COVID exposure concerns.  Waive fee, RS as able.  Marliss Czar, PhD LP Licensed Psychologist

## 2018-08-26 ENCOUNTER — Other Ambulatory Visit: Payer: Self-pay

## 2018-08-26 ENCOUNTER — Ambulatory Visit (INDEPENDENT_AMBULATORY_CARE_PROVIDER_SITE_OTHER): Payer: Medicare Other | Admitting: Psychiatry

## 2018-08-26 DIAGNOSIS — F422 Mixed obsessional thoughts and acts: Secondary | ICD-10-CM | POA: Diagnosis not present

## 2018-08-26 DIAGNOSIS — Z638 Other specified problems related to primary support group: Secondary | ICD-10-CM

## 2018-08-26 DIAGNOSIS — F4323 Adjustment disorder with mixed anxiety and depressed mood: Secondary | ICD-10-CM

## 2018-08-26 NOTE — Progress Notes (Signed)
Psychotherapy Progress Note [facility] Diana Czar, PhD LP  Patient ID: Diana [name]     MRN: [id number]     Therapy format: Individual psychotherapy Date: 08/26/2018     Start: 2:10p Stop: 3:00p Time Spent: 50 min  Telehealth visit I connected with patient by a video enabled telemedicine/telehealth application or telephone, with her informed consent, and verified patient privacy and that I am speaking with the correct person using two identifiers.  I was located at my home and patient at her home.  We discussed the limitations, risks, and security and privacy concerns associated with telehealth services and the availability of in-person appointments, including awareness that she may be responsible for charges related to the service, and she expressed understanding and agreed to proceed.  I discussed treatment planning with her, with opportunity to ask and answer all questions. Agreed with the plan, demonstrated an understanding of the instructions, and made her aware to call our office if symptoms worsen or she feels she is in a crisis state and needs immediate contact.  Session narrative (presenting needs, interim history, self-report of stressors and symptoms, applications of prior therapy, status changes, and interventions made in session) Initially seen 3/3, CA'd 3/16 due to contagion concerns (she was sick), RS'd after receiving direct notice from Diana [name] that Diana [name] and office remain available by telehealth.  Managing OK with staying home and remote shopping with [employer].  Comfortable with her preexisting OCD, helps combat impulse buys.    Got over her bad cold (did not match COVID 3rd week March.    Not sure how she will feel about going back to church, or shopping, being a germophobe to begin with.  Careful about handwashing.  Has symmetry compulsions, too.    No contact with sister since we spoke 2 months ago.  Diana [name] unfriended PT and husband on [employer].  For the  most part, not feeling guilty about the estrangement.  Reviewed family development and history, Diana [name] the 3rd child, 9 mos behind PT.  Insight Diana [name].    Mainly, wishes she could feel less anxious about things.  Knows she tends to overthink things, largely about protections.  Has an "OCD friend" who can talk her down from moments, like short-distance encounter with dog groomer.  Can research   Oriented to the "good enough principle" for perfectionistic decision-making and the neurobiology   Has gotten better about compulsively rechecking over the years, e.g., turning off electric appliances.    Discussed a goal of practicing "good enough" moments.  Encouraged to ask OCD, when pressed,   Therapeutic modalities: Cognitive Behavioral Therapy, Assertiveness/Communication, Solution-Oriented/Positive Psychology and Psycho-education/Bibliotherapy  Mental Status/Observations:  Appearance:   Neat and Well Groomed     Behavior:  Appropriate  Motor:  Normal  Speech/Language:   Clear and Coherent  Affect:  Appropriate  Mood:  anxious and euthymic  Thought process:  normal  Thought content:    Obsessions  Sensory/Perceptual disturbances:    WNL  Orientation:  grossly intact  Attention:  Good  Concentration:  Good  Memory:  WNL  Insight:    Good  Judgment:   Good  Impulse Control:  Good   Risk Assessment: Danger to Self:  No Self-injurious Behavior: No Danger to Others: No Duty to Warn:no Physical Aggression / Violence:No  Access to Firearms a concern: No   Diagnosis:   ICD-10-CM   1. Mixed obsessional thoughts and acts F42.2   2. Relationship problem with  family member Z63.8   3. Adjustment disorder with mixed anxiety and depressed mood F43.23    Assessment of progress:  improving  Plan:  . Content with letting sister estrange as she sees fit . Refocus on OCD -- esp. perfectionistic decision-making, and practice finding "good enough/close enough" moments  to journal about . Specific project to go ahead and choose -- randomly if need -- from among several options for seat cushions . Other recommendations/advice as noted above . Continue to utilize previously learned skills ad lib . Maintain medication as prescribed and work faithfully with relevant prescriber(s) if any changes are desired or seem indicated . Call the clinic on-call service, present to ER, or call 911 if any life-threatening psychiatric crisis Return in about 2 weeks (around 09/09/2018) for will call, set up as teletherapy session.   Diana Friesobert Maxten Shuler, PhD [place] Licensed Psychologist

## 2018-09-09 ENCOUNTER — Other Ambulatory Visit: Payer: Self-pay

## 2018-09-09 ENCOUNTER — Ambulatory Visit (INDEPENDENT_AMBULATORY_CARE_PROVIDER_SITE_OTHER): Payer: Medicare Other | Admitting: Psychiatry

## 2018-09-09 DIAGNOSIS — Z638 Other specified problems related to primary support group: Secondary | ICD-10-CM | POA: Diagnosis not present

## 2018-09-09 DIAGNOSIS — F422 Mixed obsessional thoughts and acts: Secondary | ICD-10-CM | POA: Diagnosis not present

## 2018-09-09 DIAGNOSIS — F4323 Adjustment disorder with mixed anxiety and depressed mood: Secondary | ICD-10-CM

## 2018-09-09 NOTE — Progress Notes (Signed)
Psychotherapy Progress Note [facility] Marliss Czar, PhD LP  Patient ID: Diana [name]     MRN: [id number]     Therapy format: Individual psychotherapy Date: 09/09/2018     Start: 2:15p Stop: 3:02p Time Spent: 47 min  Telehealth visit I connected with patient by a video enabled telemedicine/telehealth application or telephone, with her informed consent, and verified patient privacy and that I am speaking with the correct person using two identifiers.  I was located at my home and patient at her home.  We discussed the limitations, risks, and security and privacy concerns associated with telehealth services and the availability of in-person appointments, including awareness that she may be responsible for charges related to the service, and she expressed understanding and agreed to proceed.  I discussed treatment planning with her, with opportunity to ask and answer all questions. Agreed with the plan, demonstrated an understanding of the instructions, and made her aware to call our office if symptoms worsen or she feels she is in a crisis state and needs immediate contact.  Session narrative (presenting needs, interim history, self-report of stressors and symptoms, applications of prior therapy, status changes, and interventions made in session) Been exercising the 'good enough" principle to put down perfectionism, maybe 5-6 times, e.g., for cutting short comparison shopping, or cutting short checking a variety of news outlets.  Helping greatly.  Notes husband has habit of telling her things are OK, then getting sucked into reassuring.  Offered ideas for how they could make reassurance less automatic.  Was able to get to garden shop, as hoped, to break down phobia of going in public stores.    HX of childhood OCD sxs.    Hx thyroidectomy in the 1970s, on Synthroid.  On estrogen patch, for 15 years now.  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  Hx Fosamax  lowering platelets, now off it.  Celiac Dz also.    Reckoning with a June 7 invitation to neighborhood event.  Optimistic about going.  Therapeutic modalities: Cognitive Behavioral Therapy and Solution-Oriented/Positive Psychology  Mental Status/Observations:  Appearance:   Casual and Neat     Behavior:  Appropriate  Motor:  Normal  Speech/Language:   Clear and Coherent  Affect:  Appropriate  Mood:  euthymic  Thought process:  normal  Thought content:    WNL  Sensory/Perceptual disturbances:    WNL  Orientation:  grossly intact  Attention:  Good  Concentration:  Good  Memory:  WNL  Insight:    Good  Judgment:   Good  Impulse Control:  Good   Risk Assessment: Danger to Self:  No Self-injurious Behavior: No Danger to Others: No Duty to Warn:no Physical Aggression / Violence:No  Access to Firearms a concern: No   Diagnosis:   ICD-10-CM   1. Mixed obsessional thoughts and acts F42.2   2. Relationship problem with family member Z63.8   3. Adjustment disorder with mixed anxiety and depressed mood F43.23    Assessment of progress:  improving  Plan:  . Continue "good enough" response to OCD . Share options with husband for addressing reassurance needs . Other recommendations/advice as noted above . Continue to utilize previously learned skills ad lib . Maintain medication as prescribed and work faithfully with relevant prescriber(s) if any changes are desired or seem indicated . Call the clinic on-call service, present to ER, or call 911 if any life-threatening psychiatric crisis Return in about 2 weeks (around 09/23/2018) for set up as teletherapy session.  Blanchie Serve, PhD [place] Licensed Psychologist

## 2018-09-23 ENCOUNTER — Other Ambulatory Visit: Payer: Self-pay

## 2018-09-23 ENCOUNTER — Ambulatory Visit (INDEPENDENT_AMBULATORY_CARE_PROVIDER_SITE_OTHER): Payer: Medicare Other | Admitting: Psychiatry

## 2018-09-23 DIAGNOSIS — F422 Mixed obsessional thoughts and acts: Secondary | ICD-10-CM

## 2018-09-23 DIAGNOSIS — F4323 Adjustment disorder with mixed anxiety and depressed mood: Secondary | ICD-10-CM | POA: Diagnosis not present

## 2018-09-23 NOTE — Progress Notes (Signed)
Psychotherapy Progress Note [facility] Marliss CzarAndy Breleigh Carpino, PhD LP  Patient ID: Franchot HeidelbergJean Hunter Lease     MRN: [id number]     Therapy format: Individual psychotherapy Date: 09/23/2018     Start: 2:11p Stop: 3:01p Time Spent: 50 min  Telehealth visit (video) I connected with patient by a video enabled telemedicine/telehealth application or telephone, with her informed consent, and verified patient privacy and that I am speaking with the correct person using two identifiers.  I was located at my office and patient at her home.  We discussed the limitations, risks, and security and privacy concerns associated with telehealth services and the availability of in-person appointments, including awareness that she may be responsible for charges related to the service, and she expressed understanding and agreed to proceed.  I discussed treatment planning with her, with opportunity to ask and answer all questions. Agreed with the plan, demonstrated an understanding of the instructions, and made her aware to call our office if symptoms worsen or she feels she is in a crisis state and needs immediate contact.  Session narrative (presenting needs, interim history, self-report of stressors and symptoms, applications of prior therapy, status changes, and interventions made in session) Got herself -- as a "germophobe" -- to go to [employer] personally for a change.  Also finding that the "good enough principle" is very helpful at breaking down perfectionism, including compulsively getting up at a garden gathering to deadhead her flowers.  Able to release and enjoy her company more and to feel more empowered at managing intrusive urges.  Wants help now with "what if" thoughts, especially sparked by news coverage last night of civil unrest in the wake of the [employer] [place] killing.  Saw News 2 coverage in [place], then [place] coverage  In [place], which progressively horrified her and prompted her to  look further in Internet coverage, eventually seeing where a mob was going up a residential street in [place] of all places, looting homes and saying "We're taking back what's ours."  Found herself thinking what if that kind of violence migrated to where she lives, became progressively alarmed.  Was eventually able to turn off the news and stop taking in more imagery.  Affirmed noticing her own distress and taking effective action to control the flow, making sure to see her effectiveness as much or more than her potential victimization.  Educated on worry, generally, as problem-solving that go stuck on the horror of the problem, oscillating between getting in touch to figure out how one would deal with something and turning away to control the feeling without coming either to an effective action or a trustworthy source of help.  Encouraged to notice and discern built in limits to her risk, e.g., how rioters generally target public premises and businesses, either seeing them as the true oppressors, better targets of opportunity, or for fear of a homeowner having lethal force and a much better knowledge of the terrain.  Generally, she should be able to count on private home as inherently deterrent to someone who knows they're doing something that may very well incite violence in return, but otherwise, conventional law enforcement advice, when faced with the unavoidable threat of lethal force, is to comply quickly to de-escalate the aggressor.  Given her understanding of protestors' grievances, also offered a succinct way to show she is not the enemy despite majority skin color.  Appreciates the guidance.  Re. her checking compulsions and prior advice, she did take down ideas how Junior  could be her "field behavior therapist" and wean her from reassurance and checking perceived hazards for her, but no opportunity to put them to use since they rarely go out during pandemic.  Offered the possibility of role-playing  with him, if desired, to rehearse before needed so it comes to each of them more naturally later.  Therapeutic modalities: Cognitive Behavioral Therapy, Solution-Oriented/Positive Psychology and faith-sensitive  Mental Status/Observations:  Appearance:   Casual and Neat     Behavior:  Appropriate  Motor:  Normal  Speech/Language:   Clear and Coherent  Affect:  Appropriate  Mood:  anxious and responsive to warmth, humor, possibilities  Thought process:  normal  Thought content:    worries, responsive to education and redirection  Sensory/Perceptual disturbances:    WNL  Orientation:  grossly intact  Attention:  Good  Concentration:  Good  Memory:  WNL  Insight:    Good  Judgment:   Good  Impulse Control:  Good   Risk Assessment: Danger to Self:  No Self-injurious Behavior: No Danger to Others: No Duty to Warn:no Physical Aggression / Violence:No  Access to Firearms a concern: No   Diagnosis:   ICD-10-CM   1. Mixed obsessional thoughts and acts F42.2   2. Adjustment disorder with mixed anxiety and depressed mood F43.23    combination of family and societal events   Assessment of progress:  improving  Plan:  . Use imagery and rationales to "answer the question" . Other recommendations/advice as noted above . Continue to utilize previously learned skills ad lib . Maintain medication as prescribed and work faithfully with relevant prescriber(s) if any changes are desired or seem indicated . Call the clinic on-call service, present to ER, or call 911 if any life-threatening psychiatric crisis Return in about 2 weeks (around 10/07/2018) for as scheduled already, set up as teletherapy session.   Robley Fries, PhD [place] Licensed Psychologist

## 2018-10-07 ENCOUNTER — Ambulatory Visit (INDEPENDENT_AMBULATORY_CARE_PROVIDER_SITE_OTHER): Payer: Medicare Other | Admitting: Psychiatry

## 2018-10-07 ENCOUNTER — Other Ambulatory Visit: Payer: Self-pay

## 2018-10-07 DIAGNOSIS — F4323 Adjustment disorder with mixed anxiety and depressed mood: Secondary | ICD-10-CM | POA: Diagnosis not present

## 2018-10-07 DIAGNOSIS — F422 Mixed obsessional thoughts and acts: Secondary | ICD-10-CM | POA: Diagnosis not present

## 2018-10-07 NOTE — Progress Notes (Signed)
Psychotherapy Progress Note [facility] Luan Moore, PhD LP  Patient ID: Diana [name]     MRN: [id number]     Therapy format: Individual psychotherapy Date: 10/07/2018     Start: 2:15p Stop: 3:03p Time Spent: 42 min  Telehealth visit (video) I connected with patient by a video enabled telemedicine/telehealth application or telephone, with her informed consent, and verified patient privacy and that I am speaking with the correct person using two identifiers.  I was located at my office and patient at her home.  We discussed the limitations, risks, and security and privacy concerns associated with telehealth services and the availability of in-person appointments, including awareness that she may be responsible for charges related to the service, and she expressed understanding and agreed to proceed.  I discussed treatment planning with her, with opportunity to ask and answer all questions. Agreed with the plan, demonstrated an understanding of the instructions, and made her aware to call our office if symptoms worsen or she feels she is in a crisis state and needs immediate contact.  Session narrative (presenting needs, interim history, self-report of stressors and symptoms, applications of prior therapy, status changes, and interventions made in session) On a "news fast" the last 2 weeks, very limited exposure to bad news and OCD triggers.  Getting fluent in declaring "enough".  Endorsed continuing discretion over whether news would get her "informed" or "inflamed".  Experiencing some stress differing politically with a friend Hoyle Sauer, with whom she had   Mother in law is 16, alone but independent, in [place], [place].  Haven't seen her this year, were gong to see her in March before [place].  PT's germ phobia stymies any impulse to go see her, knowing she lets workers in, etc., and knowing it is possible she and husband could contaminate her, ultimately, without knowing.  Did see  her hairdresser last week, and he coughed, alarming her, though he was alert and explained it was after-lunch effect, not sickness.  Counting the days since conceivable exposure, without doing anything drastic to try to "fix" it.  Encouraged in understanding this as naturalistic E/RP.    Discussed terms for coming back to the office, clarifying what is and is not known about her insurance coverage.  PT wants to continue with telehealth for as long as possible and says she is willing to pay out of pocket even after insurance rolls back crisis telehealth provisions.  Informed specifically that I do not know and cannot guarantee how that would be billed, given the vagaries of health system billing and how noncovered services are classified, but in principle, she has the right, so long as it is not clinically contraindicated to continue remotely.  Resolved to continue her "news fast", call her mother in law, begin gearing up for other necessary outings into the world.  Therapeutic modalities: Cognitive Behavioral Therapy, Assertiveness/Communication and Solution-Oriented/Positive Psychology  Mental Status/Observations:  Appearance:   Casual and Neat     Behavior:  Appropriate  Motor:  Normal  Speech/Language:   Clear and Coherent  Affect:  Appropriate  Mood:  euthymic and anxiety relatively mild exc when spiked by OC and real-world threats  Thought process:  normal  Thought content:    WNL  Sensory/Perceptual disturbances:    WNL  Orientation:  grossly intact  Attention:  Good  Concentration:  Good  Memory:  WNL  Insight:    Good  Judgment:   Good  Impulse Control:  Good   Risk Assessment:  Danger to Self:  No Self-injurious Behavior: No Danger to Others: No Duty to Warn:no Physical Aggression / Violence:No  Access to Firearms a concern: No   Diagnosis:   ICD-10-CM   1. Mixed obsessional thoughts and acts  F42.2   2. Adjustment disorder with mixed anxiety and depressed mood  F43.23     Assessment of progress:  improving  Plan:  . Continue "news fast" as desired . Continue bearing with perceived exposures without drastic action . For flexibility's sake, consider calling mother-in-law, refining her own explanation of why not visiting, and consider mitigation tactics for virus . Other recommendations/advice as noted above . Continue to utilize previously learned skills ad lib . Maintain medication as prescribed and work faithfully with relevant prescriber(s) if any changes are desired or seem indicated . Call the clinic on-call service, present to ER, or call 911 if any life-threatening psychiatric crisis No follow-ups on file.   Robley Friesobert Hailley Byers, PhD Marliss CzarAndy Petrice Beedy, PhD LP Clinical Psychologist, [facility] [address], [address] [place], [place] [address] [phone]) [phone]

## 2018-10-21 ENCOUNTER — Ambulatory Visit (INDEPENDENT_AMBULATORY_CARE_PROVIDER_SITE_OTHER): Payer: Medicare Other | Admitting: Psychiatry

## 2018-10-21 ENCOUNTER — Other Ambulatory Visit: Payer: Self-pay

## 2018-10-21 ENCOUNTER — Ambulatory Visit: Payer: Medicare Other | Admitting: Psychiatry

## 2018-10-21 DIAGNOSIS — Z638 Other specified problems related to primary support group: Secondary | ICD-10-CM

## 2018-10-21 DIAGNOSIS — F4323 Adjustment disorder with mixed anxiety and depressed mood: Secondary | ICD-10-CM | POA: Diagnosis not present

## 2018-10-21 DIAGNOSIS — F422 Mixed obsessional thoughts and acts: Secondary | ICD-10-CM

## 2018-10-21 NOTE — Progress Notes (Signed)
Psychotherapy Progress Note [facility] Marliss CzarAndy Lititia Sen, PhD LP  Patient ID: Diana HeidelbergJean Hunter Lerew     MRN: [id number]     Therapy format: Individual psychotherapy Date: 10/21/2018     Start: 2:15p Stop: 3:05p Time Spent: 50 min Location: telehealth   Telehealth visit -- I connected with this patient by an approved telecommunication method (video), with her informed consent, and verifying identity and patient privacy.  I was located at my office and patient at her home.  As needed, we discussed the limitations, risks, and security and privacy concerns associated with telehealth service, including the availability and conditions which currently govern in-person appointments and the possibility that 3rd-party payment may not be fully guaranteed and she may be responsible for charges.  After she indicated understanding, we proceeded with the session.  Also discussed treatment planning, as needed, including ongoing verbal agreement with the plan, the opportunity to ask and answer all questions, her demonstrated understanding of instructions, and her readiness to call the office should symptoms worsen or she feels she is in a crisis state and needs more immediate and tangible assistance.  Session narrative (presenting needs, interim history, self-report of stressors and symptoms, applications of prior therapy, status changes, and interventions made in session) Overall compulsive behavior seems reduced substantially, just considers self "a germophobe".    Wellness visit and Medicare IPPE coming up, understood to have to be in-person, not telehealth, which bothers her for COVID risk.    Has not spoken with mother in law, knowing she has had a hard 2 weeks, noncommunicative with family right now, at [id number].  Best read, she is pouting about being tired and not being seen enough.  Still trying to restrain news.  A number of things upset her, particularly political news, and feeling blamed as a white  person.  Challenged to tell the difference between herself and her cause, as a conservative, offering that it is certainly possible in the social unrest we are seeing for any side to paint others too easily.  Encouraged to try to let some excessive things be said in the service of toning down debate and animosity.  While staying largely out of the news, is binge watching "Outlander", which has reignited her interest in history.  That in turn set her back to the family history book her father researched over years, which PT loved and loaned to sister Eunice BlaseDebbie, who became utterly estranged and claimed not to know where it is any more.  In and out of irritated with sister for gaming, blaming, and lying in a one-sided rivalry with her.  Offered possibility of just asking her, vulnerably, if she would find it in her heart to let PT look at it again but PT resistant to the idea, afraid she will fly angry again.  Pointed out she actually has nothing to lose, sine they are estranged anyway, and if she can approach in no way counting on results, she could either walk away with the book or walk away knowing sister is convicted by conscience to do the right thing.  If not ready to ask vulnerably, then try to consider it lost, as if it happened in a fire, and content herself with other means of remember ing her father and pursuing history.  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  Irritated seeing people refuse to use masks in public.  Therapeutic  modalities: Cognitive Behavioral Therapy, Assertiveness/Communication, Solution-Oriented/Positive Psychology and faith-sensitive  Mental Status/Observations:  Appearance:   Neat     Behavior:  Appropriate  Motor:  Normal  Speech/Language:   Clear and Coherent  Affect:  Appropriate  Mood:  irritable  Thought process:  normal  Thought content:    WNL   Sensory/Perceptual disturbances:    WNL  Orientation:  grossly intact  Attention:  Good  Concentration:  Good  Memory:  WNL  Insight:    Good  Judgment:   Good  Impulse Control:  Good   Risk Assessment: Danger to Self: No Self-injurious Behavior: No Danger to Others: No Physical Aggression / Violence: No Duty to Warn: No Access to Firearms a concern: No  Assessment of progress:  progressing modestly  Diagnosis:   ICD-10-CM   1. Mixed obsessional thoughts and acts  F42.2   2. Adjustment disorder with mixed anxiety and depressed mood  F43.23   3. Relationship problem with family member  Z63.8     Plan:  . Consider asking Jackelyn Poling for the book as a win-win scenarios rather than lose-lose . Other recommendations/advice as noted above . Continue to utilize previously learned skills ad lib . Maintain medication as prescribed and work faithfully with relevant prescriber(s) if any changes are desired or seem indicated . Call the clinic on-call service, present to ER, or call 911 if any life-threatening psychiatric crisis Return in about 2 weeks (around 11/04/2018) for will call, set up as teletherapy session.   Blanchie Serve, PhD Luan Moore, PhD LP Clinical Psychologist, [facility] [address] [place], [place] [id number] [phone]

## 2018-11-04 ENCOUNTER — Ambulatory Visit (INDEPENDENT_AMBULATORY_CARE_PROVIDER_SITE_OTHER): Payer: Medicare Other | Admitting: Psychiatry

## 2018-11-04 ENCOUNTER — Other Ambulatory Visit: Payer: Self-pay

## 2018-11-04 DIAGNOSIS — Z638 Other specified problems related to primary support group: Secondary | ICD-10-CM

## 2018-11-04 DIAGNOSIS — F4323 Adjustment disorder with mixed anxiety and depressed mood: Secondary | ICD-10-CM

## 2018-11-04 DIAGNOSIS — F422 Mixed obsessional thoughts and acts: Secondary | ICD-10-CM | POA: Diagnosis not present

## 2018-11-04 NOTE — Progress Notes (Signed)
Psychotherapy Progress Note [facility] Luan Moore, PhD LP  Patient ID: Diana [name]     MRN: [id number]     Therapy format: Individual psychotherapy Date: 11/04/2018     Start: 1:10p Stop: 2:01p Time Spent: 51 min Location: telehealth   Telehealth visit -- I connected with this patient by an approved telecommunication method (video), with her informed consent, and verifying identity and patient privacy.  I was located at my office and patient at her home.  As needed, we discussed the limitations, risks, and security and privacy concerns associated with telehealth service, including the availability and conditions which currently govern in-person appointments and the possibility that 3rd-party payment may not be fully guaranteed and she may be responsible for charges.  After she indicated understanding, we proceeded with the session.  Also discussed treatment planning, as needed, including ongoing verbal agreement with the plan, the opportunity to ask and answer all questions, her demonstrated understanding of instructions, and her readiness to call the office should symptoms worsen or she feels she is in a crisis state and needs more immediate and tangible assistance.  Session narrative (presenting needs, interim history, self-report of stressors and symptoms, applications of prior therapy, status changes, and interventions made in session) Had the talk with mother-in-law, able to offer diplomatically how they want to come visit and why they can't.  Had an inspiring half hour conversation with her, very lucid at 66 and very understanding, good for reducing PT's dread of talking.    Had a hard week for "what ifs" this week.  One scenario going to church to [employer], which entails going inside for water and contamination fears for taking an elevator the maintenance worker did before them.  Husband met her worry with a firm "It's OK."  Remarkable how well it worked,  amazed her how well it quenched OCD, wonders why.  Interpreted emotional processing and noted how husband's response was especially apt for getting at the feeligns without indulging the "facts".  Dealt with another set of "what ifs" from [place] in the house without masking.  Checked her judgment ventilating the foyer after he left -- seems like no big thing, not particular OC.  Assured that many more things seem reasonable with COVID than did before it, and it's equally important not to judge herself too harshly as it is to refrain from compulsions.  Did get in touch with PCP for a virtual visit for annual blood work, resolved to skip the [employer].  Will have blood work, but reassured they have good procedures.  Sad happening, wants feedback.  Neighbor with two adult children, moved in 2 wks ago; saw the children preparing to go home to [place], was moved to tears watching their emotional goodbye.  Evocative for her, both for empathy and the reminder of   Continues to restrain her intake of TV.  Discussed intolerance of bad news and incendiary subjects.  Recommended OK to continue her "news fast", but if her imagination fills in too much horror, seek until she finds the good news, too, e.g., people working things out together rather than resorting to vandalism -- fight imagery with imagery, don't let the thing she is worried about be the only image she holds onto.  Agrees.  Still chafing some about sister's suspected lie and holding onto a cherished book of genealogy from father.  Support provided.  Therapeutic modalities: Cognitive Behavioral Therapy and Solution-Oriented/Positive Psychology  Mental Status/Observations:  Appearance:   Casual  and Neat     Behavior:  Appropriate  Motor:  Normal  Speech/Language:   Clear and Coherent  Affect:  Appropriate  Mood:  anxious  Thought process:  normal  Thought content:    WNL  Sensory/Perceptual disturbances:    WNL  Orientation:  grossly intact   Attention:  Good  Concentration:  Good  Memory:  WNL  Insight:    Good  Judgment:   Good  Impulse Control:  Good   Risk Assessment: Danger to Self: No Self-injurious Behavior: No Danger to Others: No Physical Aggression / Violence: No Duty to Warn: No Access to Firearms a concern: No  Assessment of progress:  progressing modestly  Diagnosis:   ICD-10-CM   1. Mixed obsessional thoughts and acts  F42.2   2. Adjustment disorder with mixed anxiety and depressed mood  F43.23   3. Relationship problem with family member  Z63.8    Plan:  . Practice positive imagery of people working out racial tensions, seek good news where possible to offset imagery of bad; balance avoidance of news with looking long enough to find something encouraging . Other recommendations/advice as noted above . Continue to utilize previously learned skills ad lib . Maintain medication as prescribed and work faithfully with relevant prescriber(s) if any changes are desired or seem indicated . Call the clinic on-call service, present to ER, or call 911 if any life-threatening psychiatric crisis Return in about 2 weeks (around 11/18/2018) for will call.   Blanchie Serve, PhD Luan Moore, PhD LP Clinical Psychologist, [facility] [address] [place], [place] [address] [phone]

## 2018-11-18 ENCOUNTER — Other Ambulatory Visit: Payer: Self-pay

## 2018-11-18 ENCOUNTER — Ambulatory Visit (INDEPENDENT_AMBULATORY_CARE_PROVIDER_SITE_OTHER): Payer: Medicare Other | Admitting: Psychiatry

## 2018-11-18 DIAGNOSIS — F422 Mixed obsessional thoughts and acts: Secondary | ICD-10-CM | POA: Diagnosis not present

## 2018-11-18 DIAGNOSIS — F4323 Adjustment disorder with mixed anxiety and depressed mood: Secondary | ICD-10-CM | POA: Diagnosis not present

## 2018-11-18 NOTE — Progress Notes (Signed)
Psychotherapy Progress Note [facility] Marliss CzarAndy Mirriam Vadala, PhD LP  Patient ID: Diana HeidelbergJean Diana [name] Diana [name]     MRN: [id number]     Therapy format: Individual psychotherapy Date: 11/18/2018     Start: 1:04p Stop: 1:55p Time Spent: 51 min Location: telehealth   Telehealth visit -- I connected with this patient by an approved telecommunication method (video), with her informed consent, and verifying identity and patient privacy.  I was located at my office and patient at her home.  As needed, we discussed the limitations, risks, and security and privacy concerns associated with telehealth service, including the availability and conditions which currently govern in-person appointments and the possibility that 3rd-party payment may not be fully guaranteed and she may be responsible for charges.  After she indicated understanding, we proceeded with the session.  Also discussed treatment planning, as needed, including ongoing verbal agreement with the plan, the opportunity to ask and answer all questions, her demonstrated understanding of instructions, and her readiness to call the office should symptoms worsen or she feels she is in a crisis state and needs more immediate and tangible assistance.  Session narrative (presenting needs, interim history, self-report of stressors and symptoms, applications of prior therapy, status changes, and interventions made in session) Signed up for insurance GPS tracker service to get her rate down, but curbside pickup gives her a false negative reading for "excessive idling".    Beginning to wonder if she has gotten "too comfortable" at home.  Reviewed thinking, level of distress, observations, and concluded she is being careful, not OCD, in the measures she takes.  Reassured that it is prudent.  Fears she will descend into "what ifs" when regulations give people more perceived license to drop precautions like masking, and tempt her to roll back willingness to venture  out.  Discussed nuances of OCD vs. being careful, reframed as acute caution more than OCD, per se, affirmed prerogative to judge the situation and advocate for safe practices.  Has communicated positive reviews to several stores she saw doing a god job of managing customer compliance with anti-pandemic practices.  Blood draw next week -- arranged first thing in the morning, some dread.  Coached in recognizing she is already set to undertake a healthy exposure to fear, encouraged in making sure she gives herself credit after doing the courageous thing, not just look at it as a "narrow escape".  "Extra points" for not researching how long symptoms take to show and thwarting post-exposure checking compulsions.  Explained OCD for her functioning largely an addiction to certainty and/or reassurance, and the key tactic with an addiction being to abstain from urgent dosing.  Therapeutic modalities: Cognitive Behavioral Therapy and Solution-Oriented/Positive Psychology  Mental Status/Observations:  Appearance:   Casual and Well Groomed     Behavior:  Appropriate  Motor:  Normal  Speech/Language:   Clear and Coherent  Affect:  Appropriate  Mood:  euthymic and excpet some discontent  Thought process:  normal  Thought content:    Obsessions and relatively mild and responsive  Sensory/Perceptual disturbances:    WNL  Orientation:  grossly intact  Attention:  Good  Concentration:  Good  Memory:  WNL  Insight:    Good  Judgment:   Good  Impulse Control:  Good   Risk Assessment: Danger to Self: No Self-injurious Behavior: No Danger to Others: No Physical Aggression / Violence: No Duty to Warn: No Access to Firearms a concern: No  Assessment of progress:  progressing  Diagnosis:  ICD-10-CM   1. Mixed obsessional thoughts and acts  F42.2   2. Adjustment disorder with mixed anxiety and depressed mood  F43.23    Plan:  . Follow through with blood draw as exposure exercise, extra priority to allow  self the victory in doing so rather than second-guess her risk . Self-affirm that most of her thoughts and actions around COVID are appropriate caution, not signs of illness, and her safety prerogative.  Practice discretion whether suspect thoughts and behaviors consume time, money, energy, contentment, or goodwill before labelling them as ill or disordered. . Other recommendations/advice as noted above . Continue to utilize previously learned skills ad lib . Maintain medication as prescribed and work faithfully with relevant prescriber(s) if any changes are desired or seem indicated . Call the clinic on-call service, present to ER, or call 911 if any life-threatening psychiatric crisis Return in about 2 weeks (around 12/02/2018) for set up as teletherapy session, should schedule ahead.  Blanchie Serve, PhD Luan Moore, PhD LP Clinical Psychologist, [facility] [address] [place], [place] [address] [phone]

## 2018-12-02 ENCOUNTER — Ambulatory Visit (INDEPENDENT_AMBULATORY_CARE_PROVIDER_SITE_OTHER): Payer: Medicare Other | Admitting: Psychiatry

## 2018-12-02 ENCOUNTER — Other Ambulatory Visit: Payer: Self-pay

## 2018-12-02 DIAGNOSIS — F422 Mixed obsessional thoughts and acts: Secondary | ICD-10-CM

## 2018-12-02 DIAGNOSIS — F4323 Adjustment disorder with mixed anxiety and depressed mood: Secondary | ICD-10-CM
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
## 2018-12-16 ENCOUNTER — Other Ambulatory Visit: Payer: Self-pay

## 2018-12-16 ENCOUNTER — Ambulatory Visit (INDEPENDENT_AMBULATORY_CARE_PROVIDER_SITE_OTHER): Payer: Medicare Other | Admitting: Psychiatry

## 2018-12-16 DIAGNOSIS — Z638 Other specified problems related to primary support group: Secondary | ICD-10-CM

## 2018-12-16 DIAGNOSIS — F422 Mixed obsessional thoughts and acts: Secondary | ICD-10-CM | POA: Diagnosis not present

## 2018-12-16 NOTE — Progress Notes (Signed)
Psychotherapy Progress Note [facility] Luan Moore, PhD LP  Patient ID: Diana [name]     MRN: [id number]     Therapy format: Individual psychotherapy Date: 12/16/2018     Start: 1:10p Stop: 2:00p Time Spent: 50 min Location: telehealth   Telehealth visit -- I connected with this patient by an approved telecommunication method (video), with her informed consent, and verifying identity and patient privacy.  I was located at my office and patient at her home.  As needed, we discussed the limitations, risks, and security and privacy concerns associated with telehealth service, including the availability and conditions which currently govern in-person appointments and the possibility that 3rd-party payment may not be fully guaranteed and she may be responsible for charges.  After she indicated understanding, we proceeded with the session.  Also discussed treatment planning, as needed, including ongoing verbal agreement with the plan, the opportunity to ask and answer all questions, her demonstrated understanding of instructions, and her readiness to call the office should symptoms worsen or she feels she is in a crisis state and needs more immediate and tangible assistance.  Session narrative (presenting needs, interim history, self-report of stressors and symptoms, applications of prior therapy, status changes, and interventions made in session) More anxious past two weeks.  Tried to hold back on micromanaging Junior's doctor visit, asked him how she did, somewhat tepid review, but she did succeed in shortening and reducing the number of questions.    She has ophthalmologist tomorrow, hx of torn retina and recurrent corneal ulcers.  Great rapport, usually thorough exam, but COVID now makes the encounter more nervy.    This week also affected by sister [name]'s birthday this past week -- found herself thinking repetitively about her and their estrangement.  Knows her father  would not want it to be so.  Engaged a fantasy of speaking with father about it, some enlightenment that he would not urge her hard, just grieve the rift.    [employer]) reopened yesterday for in-person worship.  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  Found herself upset yesterday with apparent double message about masking (freedom to take it off inside, trusting only social distancing in the quite large sanctuary).  Struggling with her sense of alarm, judgment, and disappointment in her church, but identified need to ask some questions.  Discussed at some length, identifying guilt over her own angry impulses as the actual difficulty.  Validated that it is OK to observe standards and to have higher standards than those around her, the moral issue is in how she plays it.  Oriented to pronouns in respectful conflict (1st & 2nd person where you agree, 3rd person impersonal where you don't).  Resolved to ask a couple questions as well, and make sure she balances the feedback.  Will also practice with Junior.    Therapeutic modalities: Cognitive Behavioral Therapy and Solution-Oriented/Positive Psychology  Mental Status/Observations:  Appearance:   Casual     Behavior:  Appropriate  Motor:  Normal  Speech/Language:   Clear and Coherent  Affect:  Appropriate  Mood:  euthymic and mildly irritable with content  Thought process:  normal  Thought content:    Obsessions  Sensory/Perceptual disturbances:    WNL  Orientation:  grossly intact  Attention:  Good  Concentration:  Good  Memory:  WNL  Insight:    Good  Judgment:   Good  Impulse Control:  Good   Risk Assessment: Danger to Self: No Self-injurious Behavior: No Danger to Others: No Physical Aggression / Violence: No Duty to Warn: No Access to Firearms a concern: No  Assessment of progress:  progressing  Diagnosis:   ICD-10-CM   1. Mixed obsessional  thoughts and acts  F42.2   2. Relationship problem with family member  Z63.8    Plan:  . When possible, keep working on limiting OCD questioning as exercise in tolerating doubt and weakening compulsions . Apply low-judgment tactics as available . Other recommendations/advice as noted above . Continue to utilize previously learned skills ad lib . Maintain medication as prescribed and work faithfully with relevant prescriber(s) if any changes are desired or seem indicated . Call the clinic on-call service, present to ER, or call 911 if any life-threatening psychiatric crisis Return in about 2 weeks (around 12/30/2018) for teletherapy.  Robley Friesobert Ryane Canavan, PhD Marliss CzarAndy Makai Dumond, PhD LP Clinical Psychologist, [facility] [address], [address] [place], [place] [address] [phone]) [phone]

## 2018-12-31 ENCOUNTER — Ambulatory Visit (INDEPENDENT_AMBULATORY_CARE_PROVIDER_SITE_OTHER): Payer: Medicare Other | Admitting: Psychiatry

## 2018-12-31 ENCOUNTER — Other Ambulatory Visit: Payer: Self-pay

## 2018-12-31 DIAGNOSIS — F40232 Fear of other medical care: Secondary | ICD-10-CM | POA: Diagnosis not present

## 2018-12-31 DIAGNOSIS — F422 Mixed obsessional thoughts and acts: Secondary | ICD-10-CM | POA: Diagnosis not present

## 2018-12-31 DIAGNOSIS — F4323 Adjustment disorder with mixed anxiety and depressed mood: Secondary | ICD-10-CM | POA: Diagnosis not present

## 2018-12-31 NOTE — Progress Notes (Signed)
Psychotherapy Progress Note [facility] Marliss CzarAndy Suhayla Chisom, PhD LP  Patient ID: Franchot HeidelbergJean Hunter Tersigni     MRN: [id number]     Therapy format: Individual psychotherapy Date: 12/31/2018     Start: 2:11p Stop: 3:00p Time Spent: 49 min Location: telehealth   Telehealth visit -- I connected with this patient by an approved telecommunication method (video), with her informed consent, and verifying identity and patient privacy.  I was located at my office and patient at her home.  As needed, we discussed the limitations, risks, and security and privacy concerns associated with telehealth service, including the availability and conditions which currently govern in-person appointments and the possibility that 3rd-party payment may not be fully guaranteed and she may be responsible for charges.  After she indicated understanding, we proceeded with the session.  Also discussed treatment planning, as needed, including ongoing verbal agreement with the plan, the opportunity to ask and answer all questions, her demonstrated understanding of instructions, and her readiness to call the office should symptoms worsen or she feels she is in a crisis state and needs more immediate and tangible assistance.  Session narrative (presenting needs, interim history, self-report of stressors and symptoms, applications of prior therapy, status changes, and interventions made in session) Went to ophthalmologist, successfully braved the uncertainties of COVID and what may or may not be taken care of, impressed with sanitation procedures.  May need cataract surgery.  Has repetitive corneal ulcers about 2/yr.    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 Gabriel RungJoe", [facility]) is retiring end of the year.  Sad thing, special connection for a number of reasons.  Asks about handling it, advised to acknowledge her feelings more than distract and to imagine what she could say or do, or how she will cope.  Offered exercise of  imagining telling the story 10 years from now about how she coped.  Also recommendation to balance the scales with worry, working as needed to imagine how things could go better.    Continues to pace her exposure to news, which is serving her well.  Resisting friend's lobbying to bring her in on the latest outrage, was able assert her need/desire to manage how much her feelings get riled up, friend eventually quit.  Affirmed handling without anger, sticking to principle, contrasted with plausible possibility that she will see a cause worth being more informed for, but it continues to be at her personal discretion.  Therapeutic modalities: Cognitive Behavioral Therapy, Assertiveness/Communication and Solution-Oriented/Positive Psychology  Mental Status/Observations:  Appearance:   Neat     Behavior:  Appropriate  Motor:  Normal  Speech/Language:   Clear and Coherent  Affect:  Appropriate and responsive  Mood:  euthymic and except situational sadness, irritation  Thought process:  normal  Thought content:    WNL  Sensory/Perceptual disturbances:    WNL  Orientation:  grossly intact  Attention:  Good  Concentration:  Good  Memory:  WNL  Insight:    Good  Judgment:   Good  Impulse Control:  Good   Risk Assessment: Danger to Self: No Self-injurious Behavior: No Danger to Others: No Physical Aggression / Violence: No Duty to Warn: No Access to Firearms a concern: No  Assessment of progress:  progressing well  Diagnosis:   ICD-10-CM   1. Mixed obsessional thoughts and acts  F42.2   2. Fear of other medical care  F40.232   3. Adjustment disorder with mixed anxiety and depressed mood  F43.23  Plan:  . Continue stress management principles in practice . Continue to look for opportunities to take on uncertainty/anxiety for good cause . Option to draft a thank-you to beloved pastor or get an aside with him while doing midweek tasks at church . Other recommendations/advice as noted  above . Continue to utilize previously learned skills ad lib . Maintain medication as prescribed and work faithfully with relevant prescriber(s) if any changes are desired or seem indicated . Call the clinic on-call service, present to ER, or call 911 if any life-threatening psychiatric crisis Return in about 2 weeks (around 01/14/2019).  Blanchie Serve, PhD Luan Moore, PhD LP Clinical Psychologist, [facility] [address], [place], [place] [address] [phone]

## 2019-01-13 ENCOUNTER — Other Ambulatory Visit: Payer: Self-pay

## 2019-01-13 ENCOUNTER — Ambulatory Visit (INDEPENDENT_AMBULATORY_CARE_PROVIDER_SITE_OTHER): Payer: Medicare Other | Admitting: Psychiatry

## 2019-01-13 DIAGNOSIS — Z638 Other specified problems related to primary support group: Secondary | ICD-10-CM

## 2019-01-13 DIAGNOSIS — F4323 Adjustment disorder with mixed anxiety and depressed mood: Secondary | ICD-10-CM | POA: Diagnosis not present

## 2019-01-13 DIAGNOSIS — F422 Mixed obsessional thoughts and acts: Secondary | ICD-10-CM

## 2019-01-13 NOTE — Progress Notes (Signed)
Psychotherapy Progress Note [facility] Luan Moore, PhD LP  Patient ID: Diana [name]     MRN: [id number]     Therapy format: Individual psychotherapy Date: 01/13/2019     Start: 1:08p Stop: 1:58p Time Spent: 50 min Location: telehealth   Telehealth visit -- I connected with this patient by an approved telecommunication method (video), with her informed consent, and verifying identity and patient privacy.  I was located at my office and patient at her home.  As needed, we discussed the limitations, risks, and security and privacy concerns associated with telehealth service, including the availability and conditions which currently govern in-person appointments and the possibility that 3rd-party payment may not be fully guaranteed and she may be responsible for charges.  After she indicated understanding, we proceeded with the session.  Also discussed treatment planning, as needed, including ongoing verbal agreement with the plan, the opportunity to ask and answer all questions, her demonstrated understanding of instructions, and her readiness to call the office should symptoms worsen or she feels she is in a crisis state and needs more immediate and tangible assistance.  Session narrative (presenting needs, interim history, self-report of stressors and symptoms, applications of prior therapy, status changes, and interventions made in session) Been working on genealogy, which naturally brings up brings up father's allegedly lost book.  Nephew Lysbeth Galas, gifted in business, has come home as well, is living with PT's estranged sister, came over recently.  Dreaming again about the book, and wants again to see what is in the old family Bible, also in [place] possession.  Reviewed motivation to ask again for the book versus drop it.  Weighed the possibility of asking nephew to surreptitiously photograph or spirit out the book.  Would rather not put him in the middle, but could ask him  to let her know if he has seen it and leave it clear that he has the right not to be in the middle of it if he chooses.  Alternatively, framed assertive request to [name] to show mercy, honestly look for it, and have the grace to allow PT to look, copy, and return if found, apologizing PRN for her own harshness before and assuring her no other need to deal with her if desired.  PT also finds herself obsessing over tragic story of a young man in [place]) who went missing after offering to sell a car on [employer]sl[employer], reputedly a kind, helpful soul, murdered and dumped on a country road by a paroled felon stealing the car.  PT has hurt, grieved strongly for the man and his family.  Plans to send a sympathy card to his family, just can't get him off her mind and the senselessness of his killing.  Affirmed as the pain of deep empathy and offered her the image of his spirit coming to her to let her know he is OK, relieved, whole, and in a better place now.  Also offered possibility of channeling her empathic pain, in his honor, into any good acts she chooses.  Agrees, will try.  Still plenty concerned about COVID, but was able to make a short grocery run in the store, combatting phobia..  On a brighter note, is raising butterflies, a hobby for a while now.    Therapeutic modalities: Cognitive Behavioral Therapy and faith-sensitive  Mental Status/Observations:  Appearance:   Casual and Neat     Behavior:  Appropriate  Motor:  Normal  Speech/Language:   Clear and Coherent  Affect:  Appropriate  Mood:  anxious and saddened, responsive  Thought process:  normal  Thought content:    preoccupations  Sensory/Perceptual disturbances:    WNL  Orientation:  grossly intact  Attention:  Good  Concentration:  Good  Memory:  WNL  Insight:    Good  Judgment:   Good  Impulse Control:  Good   Risk Assessment: Danger to Self: No Self-injurious Behavior: No Danger to Others: No Physical Aggression /  Violence: No Duty to Warn: No Access to Firearms a concern: No  Assessment of progress:  progressing  Diagnosis:   ICD-10-CM   1. Mixed obsessional thoughts and acts  F42.2   2. Adjustment disorder with mixed anxiety and depressed mood  F43.23   3. Relationship problem with family member  Z63.8     Plan:  . Option to do something in honor of the young man Andy's life, as moved . Option to approach sister again about the book, as discussed . Other recommendations/advice as noted above . Continue to utilize previously learned skills ad lib . Maintain medication as prescribed and work faithfully with relevant prescriber(s) if any changes are desired or seem indicated . Call the clinic on-call service, present to ER, or call 911 if any life-threatening psychiatric crisis Return in about 2 weeks (around 01/27/2019).  Robley Fries, PhD Marliss Czar, PhD LP Clinical Psychologist, [facility] [address] [place], [place] [address] [phone]

## 2019-01-27 ENCOUNTER — Ambulatory Visit (INDEPENDENT_AMBULATORY_CARE_PROVIDER_SITE_OTHER): Payer: Medicare Other | Admitting: Psychiatry

## 2019-01-27 ENCOUNTER — Other Ambulatory Visit: Payer: Self-pay

## 2019-01-27 DIAGNOSIS — F422 Mixed obsessional thoughts and acts: Secondary | ICD-10-CM

## 2019-01-27 DIAGNOSIS — Z638 Other specified problems related to primary support group: Secondary | ICD-10-CM | POA: Diagnosis not present

## 2019-01-27 NOTE — Progress Notes (Signed)
Psychotherapy Progress Note [facility] Marliss Czar, PhD LP  Patient ID: Diana [name]     MRN: [id number]     Therapy format: Individual psychotherapy Date: 01/27/2019     Start: 1:10p Stop: 2:00p Time Spent: 50 min Location: telehealth   Telehealth visit -- I connected with this patient by an approved telecommunication method (video), with her informed consent, and verifying identity and patient privacy.  I was located at my office and patient at her home.  As needed, we discussed the limitations, risks, and security and privacy concerns associated with telehealth service, including the availability and conditions which currently govern in-person appointments and the possibility that 3rd-party payment may not be fully guaranteed and she may be responsible for charges.  After she indicated understanding, we proceeded with the session.  Also discussed treatment planning, as needed, including ongoing verbal agreement with the plan, the opportunity to ask and answer all questions, her demonstrated understanding of instructions, and her readiness to call the office should symptoms worsen or she feels she is in a crisis state and needs more immediate and tangible assistance.  Session narrative (presenting needs, interim history, self-report of stressors and symptoms, applications of prior therapy, status changes, and interventions made in session) Was "in a tizzy" half an hour ago -- had her blood work done, found her TSH near bottom of the normal range, suggesting hyperthyroidism (though she had thyroidectomy).  6 wks on new dose of hormone and sleep difficulties of late, now makes sense as overmedicated.  Got frustrated with no med change indicated, and no replies trying to get consultation with doctor, eventually explained that her physician was out of office.  Even after getting a real-time conversation with nurse, did not get callback, and medication running out.  Eventually  informed that appt worker had to fill a quota of in-person visits, but able to do it.  While agitated, did find notes from a prior therapy session guiding how to respond to frustration.  Reviewed how she did represent her need without becoming at all abusive or entitled, even though she knows she felt plenty irked.  Decatastrophized self-judgment for feeling and thinking things.  Got some peace tuning in to Diana [name] from Diana [name] and hearing about the young man from those who knew him.  Moved by his tragic story, she still means to make contributions or do something positive in his memory, even though she did not know him.  PT & H did end up going to the neighborhood musical gathering.  Victory in summoning interest and braving obsession triggers.    Did approach Diana [name] about looking for the Bible with genealogy info, but did not hear from him for a while, struggled to maintain patience.  He did find the Bible eventually, sent her a pic, is now pledged to take pics of genealogy listings in it.  Discussed intrusive worries if sister Diana [name] objects to him doing it, advised complete the scene, i.e., visualize what Diana [name] would do to respond.    Therapeutic modalities: Cognitive Behavioral Therapy and Solution-Oriented/Positive Psychology  Mental Status/Observations:  Appearance:   Casual and Neat     Behavior:  Appropriate  Motor:  Normal  Speech/Language:   Clear and Coherent  Affect:  Appropriate  Mood:  normal  Thought process:  normal  Thought content:    WNL  Sensory/Perceptual disturbances:    WNL  Orientation:  grossly intact  Attention:  Good  Concentration:  Good  Memory:  WNL  Insight:    Good  Judgment:   Good  Impulse Control:  Good   Risk Assessment: Danger to Self: No Self-injurious Behavior: No Danger to Others: No Physical Aggression / Violence: No Duty to Warn: No Access to Firearms a concern: No  Assessment of progress:  progressing  Diagnosis:    ICD-10-CM   1. Mixed obsessional thoughts and acts  F42.2   2. Relationship problem with family member  Z63.8     Plan:  . Practice completing the scene, esp. what nephew would do . Option to use young man's memory for inspiration paying good deeds forward . Continue ad lib braving phobic situations . Other recommendations/advice as noted above . Continue to utilize previously learned skills ad lib . Maintain medication as prescribed and work faithfully with relevant prescriber(s) if any changes are desired or seem indicated . Call the clinic on-call service, present to ER, or call 911 if any life-threatening psychiatric crisis Return in about 2 weeks (around 02/10/2019).  Diana Serve, PhD Luan Moore, PhD LP Clinical Psychologist, [facility] [address], [place], [place] [address] [phone]

## 2019-02-10 ENCOUNTER — Ambulatory Visit (INDEPENDENT_AMBULATORY_CARE_PROVIDER_SITE_OTHER): Payer: Medicare Other | Admitting: Psychiatry

## 2019-02-10 ENCOUNTER — Other Ambulatory Visit: Payer: Self-pay

## 2019-02-10 DIAGNOSIS — Z638 Other specified problems related to primary support group: Secondary | ICD-10-CM | POA: Diagnosis not present

## 2019-02-10 DIAGNOSIS — F422 Mixed obsessional thoughts and acts: Secondary | ICD-10-CM

## 2019-02-10 DIAGNOSIS — F4323 Adjustment disorder with mixed anxiety and depressed mood: Secondary | ICD-10-CM

## 2019-02-10 DIAGNOSIS — F40232 Fear of other medical care: Secondary | ICD-10-CM

## 2019-02-10 NOTE — Progress Notes (Signed)
Psychotherapy Progress Note [facility] Marliss Czar, PhD LP  Patient ID: Taryn Shellhammer     MRN: [id number]     Therapy format: Individual psychotherapy Date: 02/10/2019     Start: 1:10p Stop: 2:00p Time Spent: 50 min Location: telehealth   Telehealth visit -- I connected with this patient by an approved telecommunication method (video), with her informed consent, and verifying identity and patient privacy.  I was located at my office and patient at her home.  As needed, we discussed the limitations, risks, and security and privacy concerns associated with telehealth service, including the availability and conditions which currently govern in-person appointments and the possibility that 3rd-party payment may not be fully guaranteed and she may be responsible for charges.  After she indicated understanding, we proceeded with the session.  Also discussed treatment planning, as needed, including ongoing verbal agreement with the plan, the opportunity to ask and answer all questions, her demonstrated understanding of instructions, and her readiness to call the office should symptoms worsen or she feels she is in a crisis state and needs more immediate and tangible assistance.  Session narrative (presenting needs, interim history, self-report of stressors and symptoms, applications of prior therapy, status changes, and interventions made in session) Anxious, attributes to the election, both for political reasons and for process reasons, with mail-in, early, and on-the-day options.  Eventually figured she will hand in her mail-in ballot rather than trust the mail or brave the crowds.  Had virtual appt with endocrinologist, explained her dose change, and he agreed.  Learned her osteoporosis changes the expectations for TSH level.    Notes she has an acquired mistrust of doctors, after 6 years ago having bunion surgery that was not wise for her eroding bones.  Nephew cameron came  through with good pictures from the family Bible she wanted a look at.  Very helpful, no fallout with sister [name].    Older sister Dewayne Hatch and her husband have COVID, he hosp'd 4 days, both had suppressed sense of taste and smell.  Knows they were not as careful as she, repeatedly dining in public.  Not judging, though, just glad they are recovering, though it required steroids for him.  Scheduled herself for a community garden event with 9 other women to harvest greens for a food charity. Plan made include delivering to a church where people in need may be waiting for meal, raises perceived risk of contamination/infection.  Discussed social anxiety over being thought strange if she wears a mask to the harvest, even with outdoors, small numbers, and 6-ft distancing, and encouraged her to notice if she can tell nobody thinks her strange and common courtesy simply prevails.  Therapeutic modalities: Cognitive Behavioral Therapy and Solution-Oriented/Positive Psychology  Mental Status/Observations:  Appearance:   Casual and Neat     Behavior:  Appropriate  Motor:  Normal  Speech/Language:   Clear and Coherent  Affect:  Appropriate  Mood:  normal  Thought process:  normal  Thought content:    WNL  Sensory/Perceptual disturbances:    WNL  Orientation:  grossly intact  Attention:  Good  Concentration:  Good  Memory:  WNL  Insight:    Good  Judgment:   Good  Impulse Control:  Good   Risk Assessment: Danger to Self: No Self-injurious Behavior: No Danger to Others: No Physical Aggression / Violence: No Duty to Warn: No Access to Firearms a concern: No  Assessment of progress:  progressing  Diagnosis:   ICD-10-CM  1. Mixed obsessional thoughts and acts  F42.2   2. Relationship problem with family member  Z63.8   3. Adjustment disorder with mixed anxiety and depressed mood  F43.23   4. Fear of other medical care  [phone]     Plan:  . Continue ad lib braving phobic situations at times of  choice, e.g., charity harvest . Manage attention to take in the positive possibilities, not just what "they must think of me" . Other recommendations/advice as noted above . Continue to utilize previously learned skills ad lib . Maintain medication as prescribed and work faithfully with relevant prescriber(s) if any changes are desired or seem indicated . Call the clinic on-call service, present to ER, or call 911 if any life-threatening psychiatric crisis Return in about 2 weeks (around 02/24/2019).  Blanchie Serve, PhD Luan Moore, PhD LP Clinical Psychologist, [facility] [address] [place], [place] [address] [phone]

## 2019-03-03 ENCOUNTER — Ambulatory Visit (INDEPENDENT_AMBULATORY_CARE_PROVIDER_SITE_OTHER): Payer: Medicare Other | Admitting: Psychiatry

## 2019-03-03 ENCOUNTER — Other Ambulatory Visit: Payer: Self-pay

## 2019-03-03 DIAGNOSIS — F422 Mixed obsessional thoughts and acts: Secondary | ICD-10-CM | POA: Diagnosis not present

## 2019-03-03 DIAGNOSIS — F4323 Adjustment disorder with mixed anxiety and depressed mood: Secondary | ICD-10-CM | POA: Diagnosis not present

## 2019-03-03 DIAGNOSIS — Z638 Other specified problems related to primary support group: Secondary | ICD-10-CM

## 2019-03-03 NOTE — Progress Notes (Signed)
Psychotherapy Progress Note [facility] Marliss Czar, PhD LP  Patient ID: Diana [name]     MRN: [id number]     Therapy format: Individual psychotherapy Date: 03/03/2019     Start: 1:13p Stop: 1:58p Time Spent: 45 min Location: telehealth   Telehealth visit -- I connected with this patient by an approved telecommunication method (video), with her informed consent, and verifying identity and patient privacy.  I was located at my office and patient at her home.  As needed, we discussed the limitations, risks, and security and privacy concerns associated with telehealth service, including the availability and conditions which currently govern in-person appointments and the possibility that 3rd-party payment may not be fully guaranteed and she may be responsible for charges.  After she indicated understanding, we proceeded with the session.  Also discussed treatment planning, as needed, including ongoing verbal agreement with the plan, the opportunity to ask and answer all questions, her demonstrated understanding of instructions, and her readiness to call the office should symptoms worsen or she feels she is in a crisis state and needs more immediate and tangible assistance.  Session narrative (presenting needs, interim history, self-report of stressors and symptoms, applications of prior therapy, status changes, and interventions made in session) Unhappy about the election, frustrated that she voted, prayed, and lobbied a friend enough to change her mind (from abstaining to voting PT's candidate), believes there was significant voter fraud.  Restraining herself with TV news, doesn't trust it any more, is rolling back to relying only on [employer] for her news and stay busy with useful things and projects in her own sphere of influence, e.g., the garden project her church does for hunger.  Credits [place] influence with keeping her from flying off in these times, or incessantly  analyzing.  Been thinking about her therapy goals -- particularly needed to talk out and settle some the issue with her sister Diana [name], has become able to refocus when needed.  Has achieved a comfort zone with COVID, precautions, her authority to decide how to do things if she wants to stay safer, and is enjoying some new conveniences like online grocery orders.    Re. pandemic, her older sister Diana [name] and 46 Diana [name] had COVID last month, which is said to have accelerated his early dementia.  Word that there will be no family gatherings for holidays (TG typically at PT's, Xmas at Diana [name]'s), called off both for COVID risk and for estrangement with Diana [name].  Feels better able to handle communication and confidence with family.  Discussed outlook for therapy, well-pleased, would like to reduce to about 1/mo and see how it goes.    Therapeutic modalities: Cognitive Behavioral Therapy and Solution-Oriented/Positive Psychology  Mental Status/Observations:  Appearance:   Neat     Behavior:  Appropriate  Motor:  Normal  Speech/Language:   Clear and Coherent  Affect:  Appropriate  Mood:  euthymic  Thought process:  normal  Thought content:    WNL  Sensory/Perceptual disturbances:    WNL  Orientation:  Fully oriented  Attention:  Good  Concentration:  Good  Memory:  WNL  Insight:    Good  Judgment:   Good  Impulse Control:  Good   Risk Assessment: Danger to Self: No Self-injurious Behavior: No Danger to Others: No Physical Aggression / Violence: No Duty to Warn: No Access to Firearms a concern: No  Assessment of progress:  progressing  Diagnosis:   ICD-10-CM   1. Mixed obsessional thoughts and acts  F42.2   2. Relationship problem with family member  Z63.8   3. Adjustment disorder with mixed anxiety and depressed mood  F43.23     Plan:  . Continue to recognize and dispute irrational anxiety, self-affirm authority to decide actions without need of anger . Stay constructively engaged in  projects of choice, for meaning, sense of utility and accomplishment . Other recommendations/advice as noted above . Continue to utilize previously learned skills ad lib . Maintain medication as prescribed and work faithfully with relevant prescriber(s) if any changes are desired or seem indicated . Call the clinic on-call service, present to ER, or call 911 if any life-threatening psychiatric crisis Return in about 3 weeks (around 03/24/2019) for session(s) already scheduled.  Blanchie Serve, PhD Luan Moore, PhD LP Clinical Psychologist, [facility] [address] [place], [place] [address] [phone]

## 2019-03-17 ENCOUNTER — Ambulatory Visit: Payer: Medicare Other | Admitting: Psychiatry

## 2019-03-24 ENCOUNTER — Ambulatory Visit: Payer: Medicare Other | Admitting: Psychiatry

## 2019-03-31 ENCOUNTER — Ambulatory Visit (INDEPENDENT_AMBULATORY_CARE_PROVIDER_SITE_OTHER): Payer: Medicare Other | Admitting: Psychiatry

## 2019-03-31 ENCOUNTER — Other Ambulatory Visit: Payer: Self-pay

## 2019-03-31 DIAGNOSIS — F4323 Adjustment disorder with mixed anxiety and depressed mood: Secondary | ICD-10-CM | POA: Diagnosis not present

## 2019-03-31 DIAGNOSIS — F422 Mixed obsessional thoughts and acts: Secondary | ICD-10-CM

## 2019-03-31 NOTE — Progress Notes (Signed)
Psychotherapy Progress Note [facility] Luan Moore, PhD LP  Patient ID: Diana [name]     MRN: [id number]     Therapy format: Individual psychotherapy Date: 03/31/2019      Start: 3:22p     Stop: 4.12p     Time Spent: 50 min Location: Telehealth visit -- I connected with this patient by an approved telecommunication method (video), with her informed consent, and verifying identity and patient privacy.  I was located at my office and patient at her home.  As needed, we discussed the limitations, risks, and security and privacy concerns associated with telehealth service, including the availability and conditions which currently govern in-person appointments and the possibility that 3rd-party payment may not be fully guaranteed and she may be responsible for charges.  After she indicated understanding, we proceeded with the session.  Also discussed treatment planning, as needed, including ongoing verbal agreement with the plan, the opportunity to ask and answer all questions, her demonstrated understanding of instructions, and her readiness to call the office should symptoms worsen or she feels she is in a crisis state and needs more immediate and tangible assistance.  Session narrative (presenting needs, interim history, self-report of stressors and symptoms, applications of prior therapy, status changes, and interventions made in session) Finds herself more authoritative with her worry thoughts, coaching self consciously and deliberately about turning off and unplugging the iron.  Has also applied homegrown thought stopping technique to her what-if thoughts, effectively ending worry trains.  Otherwise, continues to play it careful with COVID.  Holidays are not happy times, historically, with family issues.  As an adult, sees Christmas too commercialized, can feel down.  Identified feeling a measure of loneliness confronting people who have different values about the holiday.   Can have awkward conversations answering people who ask what husband has gotten her for Christmas; reframed her self-explanation about not giving gifts and prioritizing charity instead as him giving her the gift of togetherness in giving to others instead.  [employer] gift exchange was fulfilling.  Still misses church, does not feel safe going.  Legendary pastor leaving in a couple weeks.  Discussed meaningful ways to bid him and his wife farewell.  Two years in January since being walled out by her sister Diana [name].  Diana [name] suffered a loss recently, Pt offered condolences, by text, to nephew, but plans to send a card.  Discussed at some length moral qualms about this, ultimately affirming that she is not only being true to her values by reaching out but considerate of her sister by not overdoing/overselling it and keeping it simple, more within her sister's habit and less apt to seem insisting.  Therapeutic modalities: Cognitive Behavioral Therapy and Solution-Oriented/Positive Psychology  Mental Status/Observations:  Appearance:   Casual and Neat     Behavior:  Appropriate  Motor:  Normal  Speech/Language:   Clear and Coherent  Affect:  Appropriate  Mood:  normal  Thought process:  normal  Thought content:    WNL  Sensory/Perceptual disturbances:    WNL  Orientation:  Fully oriented  Attention:  Good  Concentration:  Good  Memory:  WNL  Insight:    Good  Judgment:   Good  Impulse Control:  Good   Risk Assessment: Danger to Self: No Self-injurious Behavior: No Danger to Others: No Physical Aggression / Violence: No Duty to Warn: No Access to Firearms a concern: No  Assessment of progress:  progressing well  Diagnosis:   ICD-10-CM  1. Mixed obsessional thoughts and acts  F42.2   2. Adjustment disorder with mixed anxiety and depressed mood  F43.23     Plan:  . Continue using thought-stopping, "enough", once-and-done, and slowed compulsions ("tai chi") where  appropriate . Other recommendations/advice as noted above . Continue to utilize previously learned skills ad lib . Maintain medication as prescribed and work faithfully with relevant prescriber(s) if any changes are desired or seem indicated . Call the clinic on-call service, present to ER, or call 911 if any life-threatening psychiatric crisis Return in about 1 month (around 05/01/2019) for will call. Current [facility] appointments: No future appointments.  Blanchie Serve, PhD Luan Moore, PhD LP Clinical Psychologist, [facility] [address] [place], [place] [address] [phone]

## 2019-05-21 ENCOUNTER — Ambulatory Visit: Payer: 59

## 2019-05-23 ENCOUNTER — Ambulatory Visit (INDEPENDENT_AMBULATORY_CARE_PROVIDER_SITE_OTHER): Payer: Medicare Other | Admitting: Psychiatry

## 2019-05-23 DIAGNOSIS — F4323 Adjustment disorder with mixed anxiety and depressed mood: Secondary | ICD-10-CM

## 2019-05-23 DIAGNOSIS — Z638 Other specified problems related to primary support group: Secondary | ICD-10-CM | POA: Diagnosis not present

## 2019-05-23 DIAGNOSIS — F422 Mixed obsessional thoughts and acts: Secondary | ICD-10-CM | POA: Diagnosis not present

## 2019-05-23 DIAGNOSIS — F40232 Fear of other medical care: Secondary | ICD-10-CM | POA: Diagnosis not present

## 2019-05-23 NOTE — Progress Notes (Signed)
Psychotherapy Progress Note [facility] Luan Moore, PhD LP  Patient ID: Diana [name]     MRN: [id number] Therapy format: Individual psychotherapy Date: 05/23/2019      Start: 3:12p     Stop: 4:00p     Time Spent: 48 min Location: Telehealth visit -- I connected with this patient by an approved telecommunication method (video), with her informed consent, and verifying identity and patient privacy.  I was located at my office and patient at her home.  As needed, we discussed the limitations, risks, and security and privacy concerns associated with telehealth service, including the availability and conditions which currently govern in-person appointments and the possibility that 3rd-party payment may not be fully guaranteed and she may be responsible for charges.  After she indicated understanding, we proceeded with the session.  Also discussed treatment planning, as needed, including ongoing verbal agreement with the plan, the opportunity to ask and answer all questions, her demonstrated understanding of instructions, and her readiness to call the office should symptoms worsen or she feels she is in a crisis state and needs more immediate and tangible assistance.   Session narrative (presenting needs, interim history, self-report of stressors and symptoms, applications of prior therapy, status changes, and interventions made in session) Getting great mileage out of a comedy video [name], "Stop It!") provided last session. PT and H joke actively with each other and keep things lighthearted by referring to it.  Was getting out more often as of a month ago, as a phobia challenge, did OK with it.  When infection rate went up more precipitously, pulled back as a prudent safety measure, not panic-driven or OCD-like.  Proud of herself for deciding to get the vaccine, not panicking or obsessing at all, only looked up a couple articles to guide her when it comes to having  autoimmune issues (including ITP).  Wisely got her good friend Diana [name] to stop informing her of exceptional medical stories and concerns.    Moved, but also a victory to claim, that she has a good friend, 15, former Mudlogger, in [place] with Stage IV melanoma and a massive heart attack, on life support.  Sobering, clarifying, to realize what she and H are going through.  Doing great with checking handwashing, Clorox wiping things unnecessarily, unplugging things.    Holidays were quiet, but fine.  Has set of family videos on DVD of Christmases past, typically left on TV all day Xmas Day at sister Diana [name]'s, but potentially toxic for being estranged now from Diana [name].  Decided after some resistance to watch them anyway, and was fine, only minor dysphoria over the estrangement.    64th birthday last week.  Enjoyed working her garden with H and take out from [employer].  Church still streaming.  Has had numerous people she knows through church get COVID.  Feels she has conquered what she meant to with therapy.  Clear she can come back PRN.  Well-pleased with services, came originally based on high ratings online and discovered [facility], therapeutic as a plus.  Says she is well-pleased, no complaints or suggestions, wants now to make room for someone else in need.  Reviewed positively how we only met once in person and have carried through pandemic entirely on video, affirming the delivery system and format as useful and capable.  Therapeutic modalities: Cognitive Behavioral Therapy and Solution-Oriented/Positive Psychology  Mental Status/Observations:  Appearance:   Casual and Neat     Behavior:  Appropriate  Motor:  Normal  Speech/Language:   Clear and Coherent  Affect:  Appropriate and light  Mood:  normal  Thought process:  normal  Thought content:    WNL  Sensory/Perceptual disturbances:    WNL  Orientation:  Fully oriented  Attention:  Good  Concentration:  Good  Memory:  WNL   Insight:    Good  Judgment:   Good  Impulse Control:  Good   Risk Assessment: Danger to Self: No Self-injurious Behavior: No Danger to Others: No Physical Aggression / Violence: No Duty to Warn: No Access to Firearms a concern: No  Assessment of progress:  progressing well  Diagnosis:   ICD-10-CM   1. Mixed obsessional thoughts and acts  F42.2    in near remission  2. Adjustment disorder with mixed anxiety and depressed mood  F43.23    in remission   3. Relationship problem with family member  Z63.8    stable, nondistressing  4. Fear of other medical care  F40.232    significantly overcome   Plan:  . Continue to utilize previously learned skills ad lib . Maintain medication as prescribed and work faithfully with relevant prescriber(s) if any changes are desired or seem indicated . Call the clinic on-call service, present to ER, or call 911 if any life-threatening psychiatric crisis . Return if symptoms worsen or fail to improve.Marland Kitchen  Next scheduled visit in this office 06/19/2019.  Blanchie Serve, PhD Luan Moore, PhD LP Clinical Psychologist, [facility] [address] [place], [place] [address] [phone]

## 2019-05-24 ENCOUNTER — Ambulatory Visit: Payer: 59

## 2019-05-31 ENCOUNTER — Ambulatory Visit: Payer: Medicare Other

## 2019-06-04 ENCOUNTER — Ambulatory Visit: Payer: 59

## 2019-06-05 ENCOUNTER — Ambulatory Visit: Payer: Medicare Other

## 2019-06-19 ENCOUNTER — Ambulatory Visit: Payer: Medicare Other | Admitting: Psychiatry

## 2019-08-12 ENCOUNTER — Other Ambulatory Visit: Payer: Self-pay

## 2019-08-12 ENCOUNTER — Other Ambulatory Visit: Payer: Self-pay | Admitting: Family Medicine

## 2019-08-12 ENCOUNTER — Ambulatory Visit
Admission: RE | Admit: 2019-08-12 | Discharge: 2019-08-12 | Disposition: A | Discharge status: Home / Self Care | Payer: Medicare Other | Source: Ambulatory Visit | Attending: Family Medicine | Admitting: Family Medicine

## 2019-08-12 DIAGNOSIS — Z1231 Encounter for screening mammogram for malignant neoplasm of breast: Secondary | ICD-10-CM

## 2019-08-13 ENCOUNTER — Other Ambulatory Visit: Payer: Self-pay | Admitting: Family Medicine

## 2019-08-13 DIAGNOSIS — R928 Other abnormal and inconclusive findings on diagnostic imaging of breast: Secondary | ICD-10-CM

## 2019-08-22 ENCOUNTER — Other Ambulatory Visit: Payer: Self-pay | Admitting: Family Medicine

## 2019-08-22 ENCOUNTER — Ambulatory Visit
Admission: RE | Admit: 2019-08-22 | Discharge: 2019-08-22 | Disposition: A | Discharge status: Home / Self Care | Payer: Medicare Other | Source: Ambulatory Visit | Attending: Family Medicine | Admitting: Family Medicine

## 2019-08-22 ENCOUNTER — Other Ambulatory Visit: Payer: Self-pay

## 2019-08-22 DIAGNOSIS — R928 Other abnormal and inconclusive findings on diagnostic imaging of breast: Secondary | ICD-10-CM

## 2019-08-29 ENCOUNTER — Ambulatory Visit
Admission: RE | Admit: 2019-08-29 | Discharge: 2019-08-29 | Disposition: A | Discharge status: Home / Self Care | Payer: Medicare Other | Source: Ambulatory Visit | Attending: Family Medicine | Admitting: Family Medicine

## 2019-08-29 ENCOUNTER — Other Ambulatory Visit: Payer: Self-pay

## 2019-08-29 DIAGNOSIS — R928 Other abnormal and inconclusive findings on diagnostic imaging of breast: Secondary | ICD-10-CM

## 2020-03-27 ENCOUNTER — Ambulatory Visit: Payer: Medicare Other

## 2020-09-17 ENCOUNTER — Other Ambulatory Visit: Payer: Self-pay | Admitting: Family Medicine

## 2020-09-17 DIAGNOSIS — Z1231 Encounter for screening mammogram for malignant neoplasm of breast: Secondary | ICD-10-CM

## 2020-11-10 ENCOUNTER — Ambulatory Visit
Admission: RE | Admit: 2020-11-10 | Discharge: 2020-11-10 | Disposition: A | Discharge status: Home / Self Care | Payer: Medicare Other | Source: Ambulatory Visit

## 2020-11-10 ENCOUNTER — Other Ambulatory Visit: Payer: Self-pay

## 2020-11-10 DIAGNOSIS — Z1231 Encounter for screening mammogram for malignant neoplasm of breast: Secondary | ICD-10-CM

## 2021-03-09 ENCOUNTER — Ambulatory Visit: Payer: Medicare Other

## 2021-03-11 ENCOUNTER — Other Ambulatory Visit (HOSPITAL_BASED_OUTPATIENT_CLINIC_OR_DEPARTMENT_OTHER): Payer: Self-pay

## 2021-03-11 ENCOUNTER — Other Ambulatory Visit: Payer: Self-pay

## 2021-03-11 ENCOUNTER — Ambulatory Visit: Payer: Medicare Other | Attending: Internal Medicine

## 2021-03-11 DIAGNOSIS — Z23 Encounter for immunization: Secondary | ICD-10-CM

## 2021-03-11 MED ORDER — MODERNA COVID-19 BIVAL BOOSTER 50 MCG/0.5ML IM SUSP
INTRAMUSCULAR | 0 refills | Status: DC
  Filled 2021-03-11: qty 0.5, 1d supply, fill #0

## 2021-03-11 NOTE — Progress Notes (Signed)
   Covid-19 Vaccination Clinic  Name:  Sharma Lawrance    MRN: [id number] DOB: 1955-09-08  03/11/2021  Ms. Lowdermilk was observed post Covid-19 immunization for 15 minutes without incident. She was provided with Vaccine Information Sheet and instruction to access the V-Safe system.   Ms. Barkett was instructed to call 911 with any severe reactions post vaccine: Difficulty breathing  Swelling of face and throat  A fast heartbeat  A bad rash all over body  Dizziness and weakness   Immunizations Administered     Name Date Dose VIS Date Route   Moderna Covid-19 vaccine Bivalent Booster 03/11/2021 10:16 AM 0.5 mL 12/04/2020 Intramuscular   Manufacturer: Moderna   Lot: [id number]   NDC: 88416-606-30

## 2021-09-27 ENCOUNTER — Other Ambulatory Visit: Payer: Self-pay | Admitting: Family Medicine

## 2021-09-27 DIAGNOSIS — Z1231 Encounter for screening mammogram for malignant neoplasm of breast: Secondary | ICD-10-CM

## 2021-11-14 ENCOUNTER — Ambulatory Visit
Admission: RE | Admit: 2021-11-14 | Discharge: 2021-11-14 | Disposition: A | Discharge status: Home / Self Care | Payer: Medicare Other | Source: Ambulatory Visit

## 2021-11-14 DIAGNOSIS — Z1231 Encounter for screening mammogram for malignant neoplasm of breast: Secondary | ICD-10-CM

## 2021-12-27 NOTE — Progress Notes (Signed)
Cardiology Office Note:    Date:  12/28/2021   ID:  Diana [name], DOB 26-Jul-1955, MRN [id number]  PCP:  Gweneth Dimitri, MD   [facility] Providers Cardiologist:  None     Referring MD: Gweneth Dimitri, MD   No chief complaint on file.   History of Present Illness:    Diana [name] is a 66 y.o. 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 Referral notes from Dr. Corliss Blacker personally reviewed. At her visit with Dr. Corliss Blacker 11/25/2021 it was noted that she had been diagnosed with mitral valve prolapse approximately 20 years ago in the setting of mild chest pain. She also reported dyspnea with walking up stairs or a hill. Due to her diagnosis of MVP and significant family history of MI, HTN, and Afib she wished to establish routine care with cardiology.  Today, she confirms her cardiovascular family history. She reports that her father died of a massive heart attack at 68 yo. He also had congestive heart failure and was a recovering alcoholic. Her mother was a heavy smoker, and had a heart attack at 68 yo, died of pneumonia at 68 yo. She has 2 sisters with hypertension, and 1 brother with atrial fibrillation. Normally her blood pressure at home is in the 120's. Yesterday it was 126/73. She endorses white coat hypertension, which she attributes to her reading of 142/74 in clinic today (144/70 on recheck). She also monitors her heart with a smart watch. Her resting heart rate is typically in the 50's. Sometimes she experiences heart flutters, with a typically brief duration. Every once in a while she notices mild swelling in her ankles, usually after sitting around for several hours reading a book. The only times she has issues with dizziness is in the very hot  weather. Every morning she walks for exercise, with the length depending on the weather. She tries to walk outdoors for 30-45 minutes, which corresponds to a mile in 20 minutes per her watch. If she can't walk outside she will walk a route through her house. She denies any anginal symptoms. If climbing up the stairs too quickly she may become short of breath. Regarding her diet, she strictly adheres to a healthy, gluten-free diet. She was diagnosed with celiac disease in 2008. She never has fried foods. Every morning she will have 2 cups of coffee. About 4 days a week she will have a glass of wine. She denies any chest pain, headaches, syncope, orthopnea, or PND.   Past Medical History:  Diagnosis Date   Celiac disease    Elevated blood pressure reading 12/28/2021   Family history of early CAD 12/28/2021   ITP (idiopathic thrombocytopenic purpura)    Mitral valve prolapse 12/28/2021   Osteoporosis    Thyroid disease    Hypothyroid    Past Surgical History:  Procedure Laterality Date   ABDOMINAL HYSTERECTOMY  2002   TAH LSO menorrhagia, adenomyosis   APPENDECTOMY     FOOT SURGERY     THYROIDECTOMY     TONSILLECTOMY     TUBAL LIGATION      Current Medications: Current Meds  Medication Sig   cetirizine (ZYRTEC) 10 MG tablet Take 10 mg by mouth daily.   COVID-19 mRNA bivalent vaccine, Moderna, (MODERNA COVID-19 BIVAL BOOSTER) 50 MCG/0.5ML injection Inject into the muscle.  doxycycline (VIBRAMYCIN) 50 MG capsule Take 50 mg by mouth daily. For 30 days   estradiol (VIVELLE-DOT) 0.0375 MG/24HR Place 1 patch onto the skin 2 (two) times a week.   SYNTHROID 125 MCG tablet Take 62.5 mcg by mouth daily.     Allergies:   Fosamax [alendronate sodium] and Wheat bran   Social History   Socioeconomic History   Marital status: Married    Spouse name: Not on file   Number of children: Not on file   Years of education: Not on file   Highest education level: Not on file  Occupational History    Not on file  Tobacco Use   Smoking status: Never   Smokeless tobacco: Not on file  Substance and Sexual Activity   Alcohol use: Yes    Comment: Occas   Drug use: No   Sexual activity: Yes    Birth control/protection: Surgical, Post-menopausal    Comment: TAH  Other Topics Concern   Not on file  Social History Narrative   Not on file   Social Determinants of Health   Financial Resource Strain: Low Risk  (12/28/2021)   Overall Financial Resource Strain (CARDIA)    Difficulty of Paying Living Expenses: Not hard at all  Food Insecurity: No Food Insecurity (12/28/2021)   Hunger Vital Sign    Worried About Running Out of Food in the Last Year: Never true    Ran Out of Food in the Last Year: Never true  Transportation Needs: No Transportation Needs (12/28/2021)   PRAPARE - Administrator, Civil Service (Medical): No    Lack of Transportation (Non-Medical): No  Physical Activity: Sufficiently Active (12/28/2021)   Exercise Vital Sign    Days of Exercise per Week: 5 days    Minutes of Exercise per Session: 30 min  Stress: Not on file  Social Connections: Not on file     Family History: The patient's family history includes Alcoholism in her father; Atrial fibrillation in her brother; COPD in her mother; Diabetes in her father and mother; Heart attack (age of onset: 42) in her mother; Heart attack (age of onset: 24) in her father; Heart disease in her father and mother; Hypertension in her father, mother, sister, and sister; Melanoma in her father and sister.  ROS:   Please see the history of present illness.    (+) Palpitations (+) Bilateral LE edema to the ankles (+) Dizziness associated with hot weather (+) Shortness of breath with climbing stairs All other systems reviewed and are negative.  EKGs/Labs/Other Studies Reviewed:    The following studies were reviewed today:  No prior cardiovascular studies available.   EKG:   EKG is personally reviewed. 12/28/2021:  Sinus rhythm. Rate 62 bpm. LAD.  Recent Labs: No results found for requested labs within last 365 days.   Recent Lipid Panel No results found for: "CHOL", "TRIG", "HDL", "CHOLHDL", "VLDL", "LDLCALC", "LDLDIRECT"   Risk Assessment/Calculations:           Physical Exam:    Wt Readings from Last 3 Encounters:  12/28/21 126 lb 6.4 oz (57.3 kg)  07/24/13 120 lb (54.4 kg)  02/04/13 118 lb (53.5 kg)     VS:  BP (!) 144/70 (BP Location: Left Arm, Patient Position: Sitting, Cuff Size: Normal)   Pulse 62   Ht 5\' 2"  (1.575 m)   Wt 126 lb 6.4 oz (57.3 kg)   BMI 23.12 kg/m  , BMI Body mass index is 23.12 kg/m. GENERAL:  Well appearing HEENT: Pupils equal round and reactive, fundi not visualized, oral mucosa unremarkable NECK:  No jugular venous distention, waveform within normal limits, carotid upstroke brisk and symmetric, no bruits, no thyromegaly LUNGS:  Clear to auscultation bilaterally HEART:  RRR.  PMI not displaced or sustained,S1 and S2 within normal limits, no S3, no S4, no clicks, no rubs, no murmurs ABD:  Flat, positive bowel sounds normal in frequency in pitch, no bruits, no rebound, no guarding, no midline pulsatile mass, no hepatomegaly, no splenomegaly EXT:  2 plus pulses throughout, no edema, no cyanosis no clubbing SKIN:  No rashes no nodules NEURO:  Cranial nerves II through XII grossly intact, motor grossly intact throughout PSYCH:  Cognitively intact, oriented to person place and time   ASSESSMENT:    1. MVP (mitral valve prolapse)   2. Elevated blood pressure reading   3. History of cardiovascular disorder   4. Family history of early CAD   5. Mitral valve prolapse    PLAN:    Elevated blood pressure reading Blood pressure was high in the office but has been controlled at home.  She does a good job with diet and exercise.  I encouraged her to check her blood pressures twice daily at home and bring to follow-up.  History of cardiovascular disorder She has  a history of mild mitral valve prolapse.  Unable to review the echo report but not the actual echo.  She had trivial leaking of the mitral valve.  We discussed the fact that endocarditis prophylaxis is no longer required for this condition.  I do not suspect that she has any significant mitral regurgitation as I do not hear a murmur on exam.  She also has no heart failure symptoms.  Given that has been so long since her last echo, we will get a repeat baseline.  Family history of early CAD Both parents father died of heart attacks.  However they had very different lifestyles.  She has been exceedingly healthy with both diet and regular exercise.  We will get a coronary calcium score to better understand her cardiovascular risk.  No statin indicated at this time.  Mitral valve prolapse Mild mitral valve prolapse on prior echo nearly 2 decades ago.  She has no HF symptoms and no murmur on exam.  We discussed the fact that endocarditis prophylaxis is not needed.  We will get a new echo to establish a baseline but I do not think that she has any significant valvular heart disease.       Disposition: FU with APP in 4-6 weeks. FU with Kimala Horne C. Duke Salvia, MD, [facility]  PRN.  Medication Adjustments/Labs and Tests Ordered: Current medicines are reviewed at length with the patient today.  Concerns regarding medicines are outlined above.   Orders Placed This Encounter  Procedures   CT CARDIAC SCORING (SELF PAY ONLY)   EKG 12-Lead   ECHOCARDIOGRAM COMPLETE   No orders of the defined types were placed in this encounter.  Patient Instructions  Medication Instructions:  Your physician recommends that you continue on your current medications as directed. Please refer to the Current Medication list given to you today.   *If you need a refill on your cardiac medications before your next appointment, please call your pharmacy*  Lab Work: NONE  Testing/Procedures: CALCIUM SCORE - THIS WILL COST YOU $99  OUT OF POCKET   Your physician has requested that you have an echocardiogram. Echocardiography is a painless test that uses sound waves to  create images of your heart. It provides your doctor with information about the size and shape of your heart and how well your heart's chambers and valves are working. This procedure takes approximately one hour. There are no restrictions for this procedure.  Follow-Up: At [facility] you and your health needs are our priority.  As part of our continuing mission to provide you with exceptional heart care, we have created designated Provider Care Teams.  These Care Teams include your primary Cardiologist (physician) and Advanced Practice Providers (APPs -  Physician Assistants and Nurse Practitioners) who all work together to provide you with the care you need, when you need it.  We recommend signing up for the patient portal called "MyChart".  Sign up information is provided on this After Visit Summary.  MyChart is used to connect with patients for Virtual Visits (Telemedicine).  Patients are able to view lab/test results, encounter notes, upcoming appointments, etc.  Non-urgent messages can be sent to your provider as well.   To learn more about what you can do with MyChart, go to [email or web address].    Your next appointment:   4-6 week(s)  The format for your next appointment:   In Person  Provider:   Gillian Shields, NP   Other Instructions MONITOR BLOOD PRESSURE TWICE A DAY AND LOG. BRING YOUR LOG AND BLOOD PRESSURE MACHINE TO FOLLOW UP       I,Mathew Stumpf,acting as a scribe for Chilton Si, MD.,have documented all relevant documentation on the behalf of Chilton Si, MD,as directed by  Chilton Si, MD while in the presence of Chilton Si, MD.  I, Elias Dennington C. Duke Salvia, MD have reviewed all documentation for this visit.  The documentation of the exam, diagnosis, procedures, and orders on 12/28/2021 are all accurate  and complete.   Signed, Chilton Si, MD  12/28/2021 12:56 PM    [facility]

## 2021-12-28 ENCOUNTER — Ambulatory Visit (HOSPITAL_BASED_OUTPATIENT_CLINIC_OR_DEPARTMENT_OTHER): Payer: Medicare Other | Admitting: Cardiovascular Disease

## 2021-12-28 ENCOUNTER — Encounter (HOSPITAL_BASED_OUTPATIENT_CLINIC_OR_DEPARTMENT_OTHER): Payer: Self-pay | Admitting: Cardiovascular Disease

## 2021-12-28 VITALS — BP 144/70 | HR 62 | Ht 62.0 in | Wt 126.4 lb

## 2021-12-28 DIAGNOSIS — R030 Elevated blood-pressure reading, without diagnosis of hypertension: Secondary | ICD-10-CM

## 2021-12-28 DIAGNOSIS — Z8679 Personal history of other diseases of the circulatory system: Secondary | ICD-10-CM | POA: Diagnosis not present

## 2021-12-28 DIAGNOSIS — I341 Nonrheumatic mitral (valve) prolapse: Secondary | ICD-10-CM

## 2021-12-28 DIAGNOSIS — Z8249 Family history of ischemic heart disease and other diseases of the circulatory system: Secondary | ICD-10-CM | POA: Diagnosis not present

## 2021-12-28 HISTORY — DX: Elevated blood-pressure reading, without diagnosis of hypertension: R03.0

## 2021-12-28 HISTORY — DX: Nonrheumatic mitral (valve) prolapse: I34.1

## 2021-12-28 HISTORY — DX: Family history of ischemic heart disease and other diseases of the circulatory system: Z82.49

## 2021-12-28 NOTE — Assessment & Plan Note (Signed)
Both parents father died of heart attacks.  However they had very different lifestyles.  She has been exceedingly healthy with both diet and regular exercise.  We will get a coronary calcium score to better understand her cardiovascular risk.  No statin indicated at this time.

## 2021-12-28 NOTE — Assessment & Plan Note (Signed)
Mild mitral valve prolapse on prior echo nearly 2 decades ago.  She has no HF symptoms and no murmur on exam.  We discussed the fact that endocarditis prophylaxis is not needed.  We will get a new echo to establish a baseline but I do not think that she has any significant valvular heart disease.

## 2021-12-28 NOTE — Assessment & Plan Note (Signed)
Blood pressure was high in the office but has been controlled at home.  She does a good job with diet and exercise.  I encouraged her to check her blood pressures twice daily at home and bring to follow-up.

## 2021-12-28 NOTE — Patient Instructions (Addendum)
Medication Instructions:  Your physician recommends that you continue on your current medications as directed. Please refer to the Current Medication list given to you today.   *If you need a refill on your cardiac medications before your next appointment, please call your pharmacy*  Lab Work: NONE  Testing/Procedures: CALCIUM SCORE - THIS WILL COST YOU $99 OUT OF POCKET   Your physician has requested that you have an echocardiogram. Echocardiography is a painless test that uses sound waves to create images of your heart. It provides your doctor with information about the size and shape of your heart and how well your heart's chambers and valves are working. This procedure takes approximately one hour. There are no restrictions for this procedure.  Follow-Up: At [facility], you and your health needs are our priority.  As part of our continuing mission to provide you with exceptional heart care, we have created designated Provider Care Teams.  These Care Teams include your primary Cardiologist (physician) and Advanced Practice Providers (APPs -  Physician Assistants and Nurse Practitioners) who all work together to provide you with the care you need, when you need it.  We recommend signing up for the patient portal called "MyChart".  Sign up information is provided on this After Visit Summary.  MyChart is used to connect with patients for Virtual Visits (Telemedicine).  Patients are able to view lab/test results, encounter notes, upcoming appointments, etc.  Non-urgent messages can be sent to your provider as well.   To learn more about what you can do with MyChart, go to [email or web address].    Your next appointment:   4-6 week(s)  The format for your next appointment:   In Person  Provider:   Gillian Shields, NP   Other Instructions MONITOR BLOOD PRESSURE TWICE A DAY AND LOG. BRING YOUR LOG AND BLOOD PRESSURE MACHINE TO FOLLOW UP

## 2021-12-28 NOTE — Assessment & Plan Note (Signed)
She has a history of mild mitral valve prolapse.  Unable to review the echo report but not the actual echo.  She had trivial leaking of the mitral valve.  We discussed the fact that endocarditis prophylaxis is no longer required for this condition.  I do not suspect that she has any significant mitral regurgitation as I do not hear a murmur on exam.  She also has no heart failure symptoms.  Given that has been so long since her last echo, we will get a repeat baseline.

## 2021-12-29 ENCOUNTER — Encounter (HOSPITAL_BASED_OUTPATIENT_CLINIC_OR_DEPARTMENT_OTHER): Payer: Self-pay

## 2021-12-30 ENCOUNTER — Encounter (HOSPITAL_BASED_OUTPATIENT_CLINIC_OR_DEPARTMENT_OTHER): Payer: Self-pay

## 2022-01-06 ENCOUNTER — Ambulatory Visit (HOSPITAL_BASED_OUTPATIENT_CLINIC_OR_DEPARTMENT_OTHER)
Admission: RE | Admit: 2022-01-06 | Discharge: 2022-01-06 | Disposition: A | Discharge status: Home / Self Care | Payer: Medicare Other | Source: Ambulatory Visit | Attending: Cardiovascular Disease | Admitting: Cardiovascular Disease

## 2022-01-06 DIAGNOSIS — Z8249 Family history of ischemic heart disease and other diseases of the circulatory system: Secondary | ICD-10-CM

## 2022-01-06 DIAGNOSIS — R030 Elevated blood-pressure reading, without diagnosis of hypertension: Secondary | ICD-10-CM

## 2022-01-06 DIAGNOSIS — Z8679 Personal history of other diseases of the circulatory system: Secondary | ICD-10-CM | POA: Insufficient documentation

## 2022-01-07 ENCOUNTER — Other Ambulatory Visit (HOSPITAL_BASED_OUTPATIENT_CLINIC_OR_DEPARTMENT_OTHER): Payer: Medicare Other

## 2022-01-12 ENCOUNTER — Ambulatory Visit (INDEPENDENT_AMBULATORY_CARE_PROVIDER_SITE_OTHER): Payer: Medicare Other

## 2022-01-12 DIAGNOSIS — I341 Nonrheumatic mitral (valve) prolapse: Secondary | ICD-10-CM | POA: Diagnosis not present

## 2022-01-15 LAB — ECHOCARDIOGRAM COMPLETE
Area-P 1/2: 3.17 cm2
MV M vel: 2.78 m/s
MV Peak grad: 30.9 mmHg
S' Lateral: 2.07 cm

## 2022-01-30 ENCOUNTER — Ambulatory Visit (HOSPITAL_BASED_OUTPATIENT_CLINIC_OR_DEPARTMENT_OTHER): Payer: Medicare Other | Admitting: Family

## 2022-01-30 ENCOUNTER — Encounter (HOSPITAL_BASED_OUTPATIENT_CLINIC_OR_DEPARTMENT_OTHER): Payer: Self-pay | Admitting: Family

## 2022-01-30 VITALS — BP 126/68 | HR 68 | Ht 61.0 in | Wt 130.0 lb

## 2022-01-30 DIAGNOSIS — E785 Hyperlipidemia, unspecified: Secondary | ICD-10-CM

## 2022-01-30 DIAGNOSIS — I700 Atherosclerosis of aorta: Secondary | ICD-10-CM

## 2022-01-30 DIAGNOSIS — I340 Nonrheumatic mitral (valve) insufficiency: Secondary | ICD-10-CM

## 2022-01-30 MED ORDER — SIMVASTATIN 10 MG PO TABS: 10.0000 mg | ORAL_TABLET | ORAL | 2 refills | Status: DC

## 2022-01-30 NOTE — Patient Instructions (Signed)
Medication Instructions:  Your physician has recommended you make the following change in your medication:   Start: Simvastatin 10mg  three times per week   *If you need a refill on your cardiac medications before your next appointment, please call your pharmacy*   Lab Work: Please return for Lab work for fasting labs in 3 months for Fasting Lipid Panel and Liver Function Test. You may come to the...   [facility] (3rd floor) [address] [place], [place] [address]  Open: 8am-Noon and 1pm-4:30pm  Please ring the doorbell on the small table when you exit the elevator and the Lab Tech will come get you  [place] [facility] [address] [place], [place], [place] [address] Open: 8am-1pm, then 2pm-4:30pm   [place]- Please see attached locations sheet stapled to your lab work with address and hours.   If you have labs (blood work) drawn today and your tests are completely normal, you will receive your results only by: [place] (if you have MyChart) OR A paper copy in the mail If you have any lab test that is abnormal or we need to change your treatment, we will call you to review the results.  Follow-Up: At [facility], you and your health needs are our priority.  As part of our continuing mission to provide you with exceptional heart care, we have created designated Provider Care Teams.  These Care Teams include your primary Cardiologist (physician) and Advanced Practice Providers (APPs -  Physician Assistants and Nurse Practitioners) who all work together to provide you with the care you need, when you need it.  We recommend signing up for the patient portal called "MyChart".  Sign up information is provided on this After Visit Summary.  MyChart is used to connect with patients for Virtual Visits (Telemedicine).  Patients are able to view lab/test results, encounter notes, upcoming appointments, etc.  Non-urgent messages can  be sent to your provider as well.   To learn more about what you can do with MyChart, go to [email or web address].    Your next appointment:   6 month(s)  The format for your next appointment:   In Person  Provider:   Skeet Latch, MD or Laurann Montana, NP   Other Instructions Heart Healthy Diet Recommendations: A low-salt diet is recommended. Meats should be grilled, baked, or boiled. Avoid fried foods. Focus on lean protein sources like fish or chicken with vegetables and fruits. The American Heart Association is a Microbiologist!  American Heart Association Diet and Lifeystyle Recommendations   Exercise recommendations: The American Heart Association recommends 150 minutes of moderate intensity exercise weekly. Try 30 minutes of moderate intensity exercise 4-5 times per week. This could include walking, jogging, or swimming.   Important Information About Sugar

## 2022-01-30 NOTE — Progress Notes (Unsigned)
   Office Visit    Patient Name: [name] Date of Encounter: 01/30/2022  PCP:  Cari Caraway, [place]  Cardiologist:  None *** Advanced Practice Provider:  No care team member to display Electrophysiologist:  None      Chief Complaint    Diana [name] is a 66 y.o. female presents today for follow up after cardiac testing.    Past Medical History    Past Medical History:  Diagnosis Date   Celiac disease    Elevated blood pressure reading 12/28/2021   Family history of early CAD 12/28/2021   ITP (idiopathic thrombocytopenic purpura)    Mitral valve prolapse 12/28/2021   Osteoporosis    Thyroid disease    Hypothyroid   Past Surgical History:  Procedure Laterality Date   ABDOMINAL HYSTERECTOMY  2002   TAH LSO menorrhagia, adenomyosis   APPENDECTOMY     FOOT SURGERY     THYROIDECTOMY     TONSILLECTOMY     TUBAL LIGATION      Allergies  Allergies  Allergen Reactions   Fosamax [Alendronate Sodium] Other (See Comments)    Decreased platelets   Gluten Meal     Celiac Disease--Diagnosed in 2008    History of Present Illness    Diana [name] is a 66 y.o. female with a hx of *** last seen 12/28/21.  BP at homes 110s-120s/70s.   ***  NOte sher brother and sister are on SImvastatin.   EKGs/Labs/Other Studies Reviewed:   The following studies were reviewed today: ***  EKG:  No EKG today.  Recent Labs: No results found for requested labs within last 365 days.  Recent Lipid Panel No results found for: "CHOL", "TRIG", "HDL", "CHOLHDL", "VLDL", "LDLCALC", "LDLDIRECT"   Home Medications   Current Meds  Medication Sig   cetirizine (ZYRTEC) 10 MG tablet Take 10 mg by mouth daily.   estradiol (VIVELLE-DOT) 0.0375 MG/24HR Place 1 patch onto the skin 2 (two) times a week.   SYNTHROID 125 MCG tablet Take 62.5 mcg by mouth daily.     Review of Systems      All other systems reviewed and are otherwise negative except  as noted above.  Physical Exam    VS:  BP 126/68   Pulse 68   Ht 5\' 1"  (1.549 m)   Wt 130 lb (59 kg)   BMI 24.56 kg/m  , BMI Body mass index is 24.56 kg/m.  Wt Readings from Last 3 Encounters:  01/30/22 130 lb (59 kg)  12/28/21 126 lb 6.4 oz (57.3 kg)  07/24/13 120 lb (54.4 kg)     GEN: Well nourished, well developed, in no acute distress. HEENT: normal. Neck: Supple, no JVD, carotid bruits, or masses. Cardiac: ***RRR, no murmurs, rubs, or gallops. No clubbing, cyanosis, edema.  ***Radials/PT 2+ and equal bilaterally.  Respiratory:  ***Respirations regular and unlabored, clear to auscultation bilaterally. GI: Soft, nontender, nondistended. MS: No deformity or atrophy. Skin: Warm and dry, no rash. Neuro:  Strength and sensation are intact. Psych: Normal affect.  Assessment & Plan    ***         Disposition: Follow up {follow up:15908} with None or APP.  Signed, Loel Dubonnet, NP 01/30/2022, 10:44 AM [place]

## 2022-01-31 ENCOUNTER — Encounter (HOSPITAL_BASED_OUTPATIENT_CLINIC_OR_DEPARTMENT_OTHER): Payer: Self-pay | Admitting: Family

## 2022-04-21 ENCOUNTER — Other Ambulatory Visit (HOSPITAL_BASED_OUTPATIENT_CLINIC_OR_DEPARTMENT_OTHER): Payer: Self-pay | Admitting: Family

## 2022-04-21 DIAGNOSIS — E785 Hyperlipidemia, unspecified: Secondary | ICD-10-CM

## 2022-04-21 DIAGNOSIS — I700 Atherosclerosis of aorta: Secondary | ICD-10-CM

## 2022-05-06 ENCOUNTER — Encounter (HOSPITAL_BASED_OUTPATIENT_CLINIC_OR_DEPARTMENT_OTHER): Payer: Self-pay

## 2022-05-06 DIAGNOSIS — I700 Atherosclerosis of aorta: Secondary | ICD-10-CM

## 2022-05-06 DIAGNOSIS — E785 Hyperlipidemia, unspecified: Secondary | ICD-10-CM

## 2022-05-06 LAB — LIPID PANEL
Chol/HDL Ratio: 2.9 ratio (ref 0.0–4.4)
Cholesterol, Total: 159 mg/dL (ref 100–199)
HDL: 54 mg/dL (ref >39)
LDL Chol Calc (NIH): 89 mg/dL (ref 0–99)
Triglycerides: 85 mg/dL (ref 0–149)
VLDL Cholesterol Cal: 16 mg/dL (ref 5–40)

## 2022-05-06 LAB — HEPATIC FUNCTION PANEL
ALT: 13 IU/L (ref 0–32)
AST: 22 IU/L (ref 0–40)
Albumin: 4.4 g/dL (ref 3.9–4.9)
Alkaline Phosphatase: 83 IU/L (ref 44–121)
Bilirubin Total: 0.5 mg/dL (ref 0.0–1.2)
Bilirubin, Direct: 0.14 mg/dL (ref 0.00–0.40)
Total Protein: 6.4 g/dL (ref 6.0–8.5)

## 2022-05-07 MED ORDER — SIMVASTATIN 10 MG PO TABS: 10.0000 mg | ORAL_TABLET | Freq: Every day | ORAL | 1 refills | Status: DC

## 2022-05-08 ENCOUNTER — Telehealth (HOSPITAL_BASED_OUTPATIENT_CLINIC_OR_DEPARTMENT_OTHER): Payer: Self-pay

## 2022-05-08 DIAGNOSIS — E785 Hyperlipidemia, unspecified: Secondary | ICD-10-CM

## 2022-05-08 DIAGNOSIS — I700 Atherosclerosis of aorta: Secondary | ICD-10-CM

## 2022-05-08 MED ORDER — SIMVASTATIN 10 MG PO TABS: 10.0000 mg | ORAL_TABLET | Freq: Every day | ORAL | 3 refills | Status: DC

## 2022-05-08 NOTE — Telephone Encounter (Addendum)
Seen by patient Diana [name] on 05/06/2022  3:41 PM; Rx udpated and labs ordered and mailed to patient. Follow up mychart message sent to patient.    ----- Message from Loel Dubonnet, NP sent at 05/06/2022  3:21 PM EST ----- Normal liver enzymes. LDL (bad choelsterol) previously 127 now 89. Improved from previous but not yet at goal of <70.   Recommend increase Simvastatin to 10mg  daily instead of 3 times per week. Repeat FLP/LFT in 3 months.

## 2022-06-01 ENCOUNTER — Encounter (HOSPITAL_BASED_OUTPATIENT_CLINIC_OR_DEPARTMENT_OTHER): Payer: Self-pay

## 2022-09-05 ENCOUNTER — Telehealth (HOSPITAL_BASED_OUTPATIENT_CLINIC_OR_DEPARTMENT_OTHER): Payer: Self-pay

## 2022-09-05 DIAGNOSIS — E785 Hyperlipidemia, unspecified: Secondary | ICD-10-CM

## 2022-09-05 LAB — HEPATIC FUNCTION PANEL
ALT: 13 IU/L (ref 0–32)
AST: 22 IU/L (ref 0–40)
Albumin: 4.4 g/dL (ref 3.9–4.9)
Alkaline Phosphatase: 79 IU/L (ref 44–121)
Bilirubin Total: 0.5 mg/dL (ref 0.0–1.2)
Bilirubin, Direct: 0.15 mg/dL (ref 0.00–0.40)
Total Protein: 6.9 g/dL (ref 6.0–8.5)

## 2022-09-05 LAB — LIPID PANEL
Chol/HDL Ratio: 2.6 ratio (ref 0.0–4.4)
Cholesterol, Total: 160 mg/dL (ref 100–199)
HDL: 61 mg/dL (ref >39)
LDL Chol Calc (NIH): 78 mg/dL (ref 0–99)
Triglycerides: 122 mg/dL (ref 0–149)
VLDL Cholesterol Cal: 21 mg/dL (ref 5–40)

## 2022-09-05 NOTE — Progress Notes (Signed)
Cardiology Office Note:    Date:  09/07/2022   ID:  Franchot Heidelberg, DOB [birth year], MRN [id number]  PCP:  Gweneth Dimitri, MD   [facility] Providers Cardiologist:  Chilton Si, MD     Referring MD: Gweneth Dimitri, MD   CC:  Follow-up  History of Present Illness:    Diana [name] is a 67 y.o. 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 She was initially seen 12/28/2021 for history of mitral valve prolapse and family history of MI, hypertension, and Afib. Referral notes from Dr. Corliss Blacker personally reviewed. At her visit with Dr. Corliss Blacker 11/25/2021 it was noted that she had been diagnosed with mitral valve prolapse approximately 20 years prior in the setting of mild chest pain. She also reported dyspnea with walking up stairs or a hill. Due to her diagnosis of MVP and significant family history of MI, HTN, and Afib, she wished to establish routine care with cardiology.  At her initial visit, her blood pressure was high in the office but had been controlled at home. She was doing well with healthy diet and regular exercise. She had a coronary CT 12/2021 revealing a coronary calcium score of 0 and aortic atherosclerosis. Her echocardiogram showed LVEF 60-65%, grade 1 diastolic dysfunction, and borderline bileaflet mitral valve prolapse. There was mild mitral valve regurgitation and no evidence of mitral stenosis. She followed up with Gillian Shields, NP 01/2022 and was started on 10 mg simvastatin three times per week given presence of aortic atherosclerosis. As of 04/2022 her LDL had improved from 127 down to 89. Recommended to increase simvastatin to 10 mg daily. Within the next 2 weeks she developed GI distress and diarrhea; unclear if due to her simvastatin increase or in the setting of life stressors. To be certain,  she was advised to complete a 2 week statin holiday. Repeat lipids 08/2022 showed her LDL improved to 78, normal liver enzymes. It was noted that she had been taking 10 mg simvastatin 3x a week, and taking a half tablet (5 mg) on the other days. She hadn't noticed any recurring side effects and agreed to resume 1 tablet (10 mg) daily.  Today, she reports that starting yesterday she resumed a full tablet of simvastatin every day. She confirms that she had initially wanted to start on simvastatin as her siblings also took it and seemed to tolerate it well. In clinic today her blood pressure is 127/69, which is surprising to her as she endorses white coat hypertension. Occasionally she monitors her pressures at home which are usually averaging 120s/70s. She also wished to review the results of her echocardiogram 12/2021, which was reassuring overall. For the last two months, she admits to stress eating frequently. She is snacking when she normally never snacks. For exercise she does continue to walk routinely. No anginal symptoms or shortness of breath. Recently at the beach she walked 3 miles with no issues. She denies any palpitations, chest pain, peripheral edema, lightheadedness, headaches, syncope, orthopnea, or PND.   Past Medical History:  Diagnosis Date   Aortic atherosclerosis (HCC) 09/07/2022   Celiac disease    Elevated blood pressure reading 12/28/2021   Family history of early CAD 12/28/2021   ITP (idiopathic thrombocytopenic purpura)    Mitral valve prolapse 12/28/2021   Osteoporosis    Thyroid disease    Hypothyroid    Past Surgical History:  Procedure  Laterality Date   ABDOMINAL HYSTERECTOMY  2002   TAH LSO menorrhagia, adenomyosis   APPENDECTOMY     FOOT SURGERY     THYROIDECTOMY     TONSILLECTOMY     TUBAL LIGATION      Current Medications: Current Meds  Medication Sig   cetirizine (ZYRTEC) 10 MG tablet Take 10 mg by mouth daily.   estradiol (VIVELLE-DOT) 0.0375 MG/24HR  Place 1 patch onto the skin 2 (two) times a week.   simvastatin (ZOCOR) 10 MG tablet Take 1 tablet (10 mg total) by mouth daily.   SYNTHROID 125 MCG tablet Take 62.5 mcg by mouth daily.     Allergies:   Fosamax [alendronate sodium] and Gluten meal   Social History   Socioeconomic History   Marital status: Married    Spouse name: Not on file   Number of children: Not on file   Years of education: Not on file   Highest education level: Not on file  Occupational History   Not on file  Tobacco Use   Smoking status: Never   Smokeless tobacco: Not on file  Substance and Sexual Activity   Alcohol use: Yes    Comment: Occas   Drug use: No   Sexual activity: Yes    Birth control/protection: Surgical, Post-menopausal    Comment: TAH  Other Topics Concern   Not on file  Social History Narrative   Not on file   Social Determinants of Health   Financial Resource Strain: Low Risk  (12/28/2021)   Overall Financial Resource Strain (CARDIA)    Difficulty of Paying Living Expenses: Not hard at all  Food Insecurity: No Food Insecurity (12/28/2021)   Hunger Vital Sign    Worried About Running Out of Food in the Last Year: Never true    Ran Out of Food in the Last Year: Never true  Transportation Needs: No Transportation Needs (12/28/2021)   PRAPARE - Administrator, Civil Service (Medical): No    Lack of Transportation (Non-Medical): No  Physical Activity: Sufficiently Active (12/28/2021)   Exercise Vital Sign    Days of Exercise per Week: 5 days    Minutes of Exercise per Session: 30 min  Stress: Not on file  Social Connections: Not on file     Family History: The patient's family history includes Alcoholism in her father; Atrial fibrillation in her brother; COPD in her mother; Diabetes in her father and mother; Heart attack (age of onset: 70) in her mother; Heart attack (age of onset: 28) in her father; Heart disease in her father and mother; Hypertension in her father, mother,  sister, and sister; Melanoma in her father and sister.  ROS:   Please see the history of present illness.    (+) Stress All other systems reviewed and are negative.  EKGs/Labs/Other Studies Reviewed:    The following studies were reviewed today:  Echo  01/12/2022:  1. Left ventricular ejection fraction, by estimation, is 60 to 65%. The  left ventricle has normal function. The left ventricle has no regional  wall motion abnormalities. Left ventricular diastolic parameters are  consistent with Grade I diastolic  dysfunction (impaired relaxation).   2. Right ventricular systolic function is normal. The right ventricular  size is normal. Tricuspid regurgitation signal is inadequate for assessing  PA pressure.   3. Borderline bileaflet mitral valve prolapse. The mitral valve is  grossly normal. Mild mitral valve regurgitation. No evidence of mitral  stenosis.   4. The aortic  valve is tricuspid. Aortic valve regurgitation is not  visualized. No aortic stenosis is present.   5. The inferior vena cava is normal in size with greater than 50%  respiratory variability, suggesting right atrial pressure of 3 mmHg.   CT Cardiac Scoring  01/06/2022: IMPRESSION: 1.  Coronary calcium score of 0. 2.  Aortic atherosclerosis.  EKG:   EKG is personally reviewed. 09/07/2022:  EKG was not ordered. 12/28/2021: Sinus rhythm. Rate 62 bpm. LAD.  Recent Labs: 09/04/2022: ALT 13   Recent Lipid Panel    Component Value Date/Time   CHOL 160 09/04/2022 0842   TRIG 122 09/04/2022 0842   HDL 61 09/04/2022 0842   CHOLHDL 2.6 09/04/2022 0842   LDLCALC 78 09/04/2022 0842     Risk Assessment/Calculations:          Physical Exam:    Wt Readings from Last 3 Encounters:  09/07/22 132 lb 12.8 oz (60.2 kg)  01/30/22 130 lb (59 kg)  12/28/21 126 lb 6.4 oz (57.3 kg)     VS:  BP 127/69 (BP Location: Left Arm, Patient Position: Sitting, Cuff Size: Normal)   Pulse (!) 53   Ht 5\' 1"  (1.549 m)   Wt 132 lb  12.8 oz (60.2 kg)   SpO2 98%   BMI 25.09 kg/m  , BMI Body mass index is 25.09 kg/m. GENERAL:  Well appearing HEENT: Pupils equal round and reactive, fundi not visualized, oral mucosa unremarkable NECK:  No jugular venous distention, waveform within normal limits, carotid upstroke brisk and symmetric, no bruits, no thyromegaly LUNGS:  Clear to auscultation bilaterally HEART:  RRR.  PMI not displaced or sustained,S1 and S2 within normal limits, no S3, no S4, no clicks, no rubs, no murmurs ABD:  Flat, positive bowel sounds normal in frequency in pitch, no bruits, no rebound, no guarding, no midline pulsatile mass, no hepatomegaly, no splenomegaly EXT:  2 plus pulses throughout, no edema, no cyanosis no clubbing SKIN:  No rashes no nodules NEURO:  Cranial nerves II through XII grossly intact, motor grossly intact throughout PSYCH:  Cognitively intact, oriented to person place and time   ASSESSMENT:    1. Mitral valve prolapse   2. Aortic atherosclerosis (HCC)     PLAN:    Mitral valve prolapse Trivial MVP on echo with no significant mitral regurgitation.  Aortic atherosclerosis (HCC) Aortic atherosclerosis noted on CT.  She reduced her simvastatin due to concern of side effects but now thinks that this was not causing her side effects.  She will start back taking it 10 mg daily and check lipids and a CMP in a couple months.  Encouraged her to start back exercising.   Disposition: FU with Elantra Caprara C. Duke Salvia, MD, [facility] in 1 year.  Medication Adjustments/Labs and Tests Ordered: Current medicines are reviewed at length with the patient today.  Concerns regarding medicines are outlined above.   No orders of the defined types were placed in this encounter.  No orders of the defined types were placed in this encounter.  Patient Instructions  Medication Instructions:  INCREASE YOUR SIMVASTATIN TO 10 MG DAILY   *If you need a refill on your cardiac medications before your next  appointment, please call your pharmacy*  Lab Work: FASTING LP/CMET IN 2 MONTHS   If you have labs (blood work) drawn today and your tests are completely normal, you will receive your results only by: MyChart Message (if you have MyChart) OR A paper copy in the mail If you have  any lab test that is abnormal or we need to change your treatment, we will call you to review the results.  Testing/Procedures: NONE  Follow-Up: At [facility], you and your health needs are our priority.  As part of our continuing mission to provide you with exceptional heart care, we have created designated Provider Care Teams.  These Care Teams include your primary Cardiologist (physician) and Advanced Practice Providers (APPs -  Physician Assistants and Nurse Practitioners) who all work together to provide you with the care you need, when you need it.  We recommend signing up for the patient portal called "MyChart".  Sign up information is provided on this After Visit Summary.  MyChart is used to connect with patients for Virtual Visits (Telemedicine).  Patients are able to view lab/test results, encounter notes, upcoming appointments, etc.  Non-urgent messages can be sent to your provider as well.   To learn more about what you can do with MyChart, go to [email or web address].    Your next appointment:   12 month(s)  Provider:   Chilton Si, MD       [facility] Stumpf,acting as a scribe for Chilton Si, MD.,have documented all relevant documentation on the behalf of Chilton Si, MD,as directed by  Chilton Si, MD while in the presence of Chilton Si, MD.  I, Deuce Paternoster C. Duke Salvia, MD have reviewed all documentation for this visit.  The documentation of the exam, diagnosis, procedures, and orders on 09/07/2022 are all accurate and complete.  Signed, Chilton Si, MD  09/07/2022 8:36 AM    [facility]

## 2022-09-05 NOTE — Telephone Encounter (Addendum)
Has been taking 10mg  3x per week, and a half tablet on the other days.  She states that she will go back to back to taking a whole tablet daily as she hasn't been noting anymore side effects. Repeat labs ordered and mailed to patient.   ----- Message from Alver Sorrow, NP sent at 09/05/2022  7:38 AM EDT ----- Normal liver enzymes.  Prior LDL (bad cholesterol) was 098 then down to 89 now 78. It is showing improvement. Ultimately our goal is LDL <70.   We can either: 1. Increase Simvastatin to 20mg  daily 2. Continue with lifestyle changes and repeat cholesterol labs in 6 months

## 2022-09-06 ENCOUNTER — Other Ambulatory Visit: Payer: Self-pay | Admitting: Family Medicine

## 2022-09-06 DIAGNOSIS — Z1231 Encounter for screening mammogram for malignant neoplasm of breast: Secondary | ICD-10-CM

## 2022-09-07 ENCOUNTER — Encounter (HOSPITAL_BASED_OUTPATIENT_CLINIC_OR_DEPARTMENT_OTHER): Payer: Self-pay | Admitting: Cardiovascular Disease

## 2022-09-07 ENCOUNTER — Ambulatory Visit (HOSPITAL_BASED_OUTPATIENT_CLINIC_OR_DEPARTMENT_OTHER): Payer: Medicare Other | Admitting: Cardiovascular Disease

## 2022-09-07 VITALS — BP 127/69 | HR 53 | Ht 61.0 in | Wt 132.8 lb

## 2022-09-07 DIAGNOSIS — I700 Atherosclerosis of aorta: Secondary | ICD-10-CM | POA: Insufficient documentation

## 2022-09-07 DIAGNOSIS — E785 Hyperlipidemia, unspecified: Secondary | ICD-10-CM | POA: Diagnosis not present

## 2022-09-07 DIAGNOSIS — Z5181 Encounter for therapeutic drug level monitoring: Secondary | ICD-10-CM | POA: Diagnosis not present

## 2022-09-07 DIAGNOSIS — I341 Nonrheumatic mitral (valve) prolapse: Secondary | ICD-10-CM | POA: Diagnosis not present

## 2022-09-07 HISTORY — DX: Atherosclerosis of aorta: I70.0

## 2022-09-07 MED ORDER — SIMVASTATIN 10 MG PO TABS: 10.0000 mg | ORAL_TABLET | Freq: Every day | ORAL | 3 refills | Status: DC

## 2022-09-07 NOTE — Assessment & Plan Note (Signed)
Aortic atherosclerosis noted on CT.  She reduced her simvastatin due to concern of side effects but now thinks that this was not causing her side effects.  She will start back taking it 10 mg daily and check lipids and a CMP in a couple months.  Encouraged her to start back exercising.

## 2022-09-07 NOTE — Patient Instructions (Signed)
Medication Instructions:  INCREASE YOUR SIMVASTATIN TO 10 MG DAILY   *If you need a refill on your cardiac medications before your next appointment, please call your pharmacy*  Lab Work: FASTING LP/CMET IN 2 MONTHS   If you have labs (blood work) drawn today and your tests are completely normal, you will receive your results only by: MyChart Message (if you have MyChart) OR A paper copy in the mail If you have any lab test that is abnormal or we need to change your treatment, we will call you to review the results.  Testing/Procedures: NONE  Follow-Up: At [facility], you and your health needs are our priority.  As part of our continuing mission to provide you with exceptional heart care, we have created designated Provider Care Teams.  These Care Teams include your primary Cardiologist (physician) and Advanced Practice Providers (APPs -  Physician Assistants and Nurse Practitioners) who all work together to provide you with the care you need, when you need it.  We recommend signing up for the patient portal called "MyChart".  Sign up information is provided on this After Visit Summary.  MyChart is used to connect with patients for Virtual Visits (Telemedicine).  Patients are able to view lab/test results, encounter notes, upcoming appointments, etc.  Non-urgent messages can be sent to your provider as well.   To learn more about what you can do with MyChart, go to [email or web address].    Your next appointment:   12 month(s)  Provider:   Chilton Si, MD

## 2022-09-07 NOTE — Assessment & Plan Note (Signed)
Trivial MVP on echo with no significant mitral regurgitation.

## 2022-09-11 DIAGNOSIS — Z1231 Encounter for screening mammogram for malignant neoplasm of breast: Secondary | ICD-10-CM

## 2022-09-19 ENCOUNTER — Other Ambulatory Visit: Payer: Self-pay

## 2022-09-19 ENCOUNTER — Emergency Department (HOSPITAL_BASED_OUTPATIENT_CLINIC_OR_DEPARTMENT_OTHER)
Admission: EM | Admit: 2022-09-19 | Discharge: 2022-09-19 | Disposition: A | Discharge status: Home / Self Care | Payer: Medicare Other | Attending: Emergency Medicine | Admitting: Emergency Medicine

## 2022-09-19 ENCOUNTER — Emergency Department (HOSPITAL_BASED_OUTPATIENT_CLINIC_OR_DEPARTMENT_OTHER): Payer: Medicare Other

## 2022-09-19 ENCOUNTER — Encounter (HOSPITAL_BASED_OUTPATIENT_CLINIC_OR_DEPARTMENT_OTHER): Payer: Self-pay | Admitting: Emergency Medicine

## 2022-09-19 DIAGNOSIS — K529 Noninfective gastroenteritis and colitis, unspecified: Secondary | ICD-10-CM | POA: Insufficient documentation

## 2022-09-19 DIAGNOSIS — R1111 Vomiting without nausea: Secondary | ICD-10-CM | POA: Diagnosis present

## 2022-09-19 LAB — COMPREHENSIVE METABOLIC PANEL
ALT: 13 U/L (ref 0–44)
AST: 21 U/L (ref 15–41)
Albumin: 4.6 g/dL (ref 3.5–5.0)
Alkaline Phosphatase: 70 U/L (ref 38–126)
Anion gap: 11 (ref 5–15)
BUN: 18 mg/dL (ref 8–23)
CO2: 25 mmol/L (ref 22–32)
Calcium: 9.1 mg/dL (ref 8.9–10.3)
Chloride: 103 mmol/L (ref 98–111)
Creatinine, Ser: 0.61 mg/dL (ref 0.44–1.00)
GFR, Estimated: >60 mL/min (ref >60)
Glucose, Bld: 154 mg/dL — ABNORMAL HIGH (ref 70–99)
Potassium: 3.7 mmol/L (ref 3.5–5.1)
Sodium: 139 mmol/L (ref 135–145)
Total Bilirubin: 0.5 mg/dL (ref 0.3–1.2)
Total Protein: 7.2 g/dL (ref 6.5–8.1)

## 2022-09-19 LAB — CBC
HCT: 43.9 % (ref 36.0–46.0)
Hemoglobin: 14.7 g/dL (ref 12.0–15.0)
MCH: 30.8 pg (ref 26.0–34.0)
MCHC: 33.5 g/dL (ref 30.0–36.0)
MCV: 91.8 fL (ref 80.0–100.0)
Platelets: 219 10*3/uL (ref 150–400)
RBC: 4.78 MIL/uL (ref 3.87–5.11)
RDW: 13.2 % (ref 11.5–15.5)
WBC: 14.3 10*3/uL — ABNORMAL HIGH (ref 4.0–10.5)
nRBC: 0 % (ref 0.0–0.2)

## 2022-09-19 LAB — LIPASE, BLOOD: Lipase: 12 U/L (ref 11–51)

## 2022-09-19 MED ORDER — ONDANSETRON 8 MG PO TBDP: 8.0000 mg | ORAL_TABLET | Freq: Three times a day (TID) | ORAL | 0 refills | Status: DC | PRN

## 2022-09-19 MED ORDER — CIPROFLOXACIN IN D5W 400 MG/200ML IV SOLN
400.0000 mg | Freq: Once | INTRAVENOUS | Status: AC
  Administered 2022-09-19: 400 mg via INTRAVENOUS
  Filled 2022-09-19: qty 200

## 2022-09-19 MED ORDER — CIPROFLOXACIN HCL 500 MG PO TABS: 500.0000 mg | ORAL_TABLET | Freq: Two times a day (BID) | ORAL | 0 refills | Status: DC

## 2022-09-19 MED ORDER — METRONIDAZOLE 500 MG PO TABS: 500.0000 mg | ORAL_TABLET | Freq: Two times a day (BID) | ORAL | 0 refills | Status: DC

## 2022-09-19 MED ORDER — SODIUM CHLORIDE 0.9 % IV BOLUS
500.0000 mL | Freq: Once | INTRAVENOUS | Status: AC
  Administered 2022-09-19: 500 mL via INTRAVENOUS

## 2022-09-19 MED ORDER — IOHEXOL 300 MG/ML  SOLN
100.0000 mL | Freq: Once | INTRAMUSCULAR | Status: AC | PRN
  Administered 2022-09-19: 80 mL via INTRAVENOUS

## 2022-09-19 MED ORDER — METRONIDAZOLE 500 MG PO TABS
500.0000 mg | ORAL_TABLET | Freq: Once | ORAL | Status: AC
  Administered 2022-09-19: 500 mg via ORAL
  Filled 2022-09-19: qty 1

## 2022-09-19 NOTE — ED Provider Notes (Signed)
[place] EMERGENCY DEPARTMENT AT [facility] Provider Note   CSN: [id number] Arrival date & time: 09/19/22  1191     History  Chief Complaint  Patient presents with   Diarrhea   Emesis    Diana [name] is a 67 y.o. female.   Diarrhea Associated symptoms: vomiting   Emesis Associated symptoms: diarrhea      Patient has a history of osteoporosis, celiac disease, ITP status post appendectomy abdominal hysterectomy who presents to the ED with abdominal pain vomiting and diarrhea.  Patient states she started having the symptoms last night.  She woke up in the night with severe abdominal pain.  She felt like she had to vomit.  She was diaphoretic.  Patient states she then started having episodes of vomiting and diarrhea throughout the night.  This morning she had another episode and when she went to the bathroom she had a bloody mucoid stool.  She denies any prior history of intestinal bleeding.  Patient states she did eat barbecue pork last night.  It tasted very fatty to her.  She does not usually eat meals like this.  She is not sure if that may have triggered her symptoms.  Her husband ate the same thing and he was fine.  Currently she is not having any pain or nausea  Home Medications Prior to Admission medications   Medication Sig Start Date End Date Taking? Authorizing Provider  ciprofloxacin (CIPRO) 500 MG tablet Take 1 tablet (500 mg total) by mouth 2 (two) times daily. 09/19/22  Yes Linwood Dibbles, MD  metroNIDAZOLE (FLAGYL) 500 MG tablet Take 1 tablet (500 mg total) by mouth 2 (two) times daily. 09/19/22  Yes Linwood Dibbles, MD  ondansetron (ZOFRAN-ODT) 8 MG disintegrating tablet Take 1 tablet (8 mg total) by mouth every 8 (eight) hours as needed for nausea or vomiting. 09/19/22  Yes Linwood Dibbles, MD  cetirizine (ZYRTEC) 10 MG tablet Take 10 mg by mouth daily.    [provider]  estradiol (VIVELLE-DOT) 0.0375 MG/24HR Place 1 patch onto the skin 2 (two) times a week.     [provider]  simvastatin (ZOCOR) 10 MG tablet Take 1 tablet (10 mg total) by mouth daily. 09/07/22   Chilton Si, MD  SYNTHROID 125 MCG tablet Take 62.5 mcg by mouth daily. 12/01/21   [provider]      Allergies    Fosamax [alendronate sodium] and Gluten meal    Review of Systems   Review of Systems  Gastrointestinal:  Positive for diarrhea and vomiting.    Physical Exam Updated Vital Signs BP (!) 166/61   Pulse (!) 58   Temp 97.6 F (36.4 C) (Oral)   Resp 16   Ht 1.549 m (5\' 1" )   Wt 60 kg   SpO2 99%   BMI 24.99 kg/m  Physical Exam Vitals and nursing note reviewed.  Constitutional:      General: She is not in acute distress.    Appearance: She is well-developed.  HENT:     Head: Normocephalic and atraumatic.     Right Ear: External ear normal.     Left Ear: External ear normal.  Eyes:     General: No scleral icterus.       Right eye: No discharge.        Left eye: No discharge.     Conjunctiva/sclera: Conjunctivae normal.  Neck:     Trachea: No tracheal deviation.  Cardiovascular:     Rate and Rhythm:  Normal rate and regular rhythm.  Pulmonary:     Effort: Pulmonary effort is normal. No respiratory distress.     Breath sounds: Normal breath sounds. No stridor. No wheezing or rales.  Abdominal:     General: Bowel sounds are normal. There is no distension.     Palpations: Abdomen is soft.     Tenderness: There is no abdominal tenderness. There is no guarding or rebound.  Musculoskeletal:        General: No tenderness or deformity.     Cervical back: Neck supple.  Skin:    General: Skin is warm and dry.     Findings: No rash.  Neurological:     General: No focal deficit present.     Mental Status: She is alert.     Cranial Nerves: No cranial nerve deficit, dysarthria or facial asymmetry.     Sensory: No sensory deficit.     Motor: No abnormal muscle tone or seizure activity.     Coordination: Coordination normal.   Psychiatric:        Mood and Affect: Mood normal.     ED Results / Procedures / Treatments   Labs (all labs ordered are listed, but only abnormal results are displayed) Labs Reviewed  COMPREHENSIVE METABOLIC PANEL - Abnormal; Notable for the following components:      Result Value   Glucose, Bld 154 (*)    All other components within normal limits  CBC - Abnormal; Notable for the following components:   WBC 14.3 (*)    All other components within normal limits  LIPASE, BLOOD    EKG None  Radiology CT ABDOMEN PELVIS W CONTRAST  Result Date: 09/19/2022 CLINICAL DATA:  Left lower quadrant abdominal pain, diarrhea EXAM: CT ABDOMEN AND PELVIS WITH CONTRAST TECHNIQUE: Multidetector CT imaging of the abdomen and pelvis was performed using the standard protocol following bolus administration of intravenous contrast. RADIATION DOSE REDUCTION: This exam was performed according to the departmental dose-optimization program which includes automated exposure control, adjustment of the mA and/or kV according to patient size and/or use of iterative reconstruction technique. CONTRAST:  80mL OMNIPAQUE IOHEXOL 300 MG/ML  SOLN COMPARISON:  03/23/2004 FINDINGS: Lower chest: Small linear densities in the posterior lower lung fields may suggest scarring or subsegmental atelectasis. Hepatobiliary: No focal abnormalities are seen in liver. There is no dilation of bile ducts. Gallbladder is not distended. Pancreas: No focal abnormalities are seen. Spleen: Unremarkable. Adrenals/Urinary Tract: Adrenals are unremarkable. There is no hydronephrosis. There are no renal or ureteral stones. Urinary bladder is not distended. Stomach/Bowel: Stomach is unremarkable. Small bowel loops are not dilated. Appendix is not seen. There is diffuse wall thickening in transverse colon, and descending colon and sigmoid colon. Scattered diverticula are seen without signs of focal acute diverticulitis. There is mild pericolic stranding  without any loculated fluid collections. Vascular/Lymphatic: Arterial calcifications and atherosclerotic plaques are noted in aorta and its major branches. Reproductive: Uterus is not seen. Other: There is no ascites or pneumoperitoneum. Small umbilical hernia containing fat is seen. Musculoskeletal: Fluid density is noted in sacral spinal canal, possibly perineural cysts or arachnoid cysts. Similar finding was seen in previous CT suggesting a benign process. IMPRESSION: There is diffuse abnormal wall thickening and mucosal enhancement in transverse colon, descending colon and rectosigmoid. Findings suggest acute inflammatory or infectious colitis. There are no loculated pericolic fluid collections. Few scattered diverticula are seen in colon without signs of focal acute diverticulitis. There is no evidence of intestinal obstruction or pneumoperitoneum.  There is no hydronephrosis. Atherosclerosis. Electronically Signed   By: Ernie Avena M.D.   On: 09/19/2022 11:44    Procedures Procedures    Medications Ordered in ED Medications  ciprofloxacin (CIPRO) IVPB 400 mg (400 mg Intravenous New Bag/Given 09/19/22 1221)  sodium chloride 0.9 % bolus 500 mL (0 mLs Intravenous Stopped 09/19/22 1118)  iohexol (OMNIPAQUE) 300 MG/ML solution 100 mL (80 mLs Intravenous Contrast Given 09/19/22 1106)  metroNIDAZOLE (FLAGYL) tablet 500 mg (500 mg Oral Given 09/19/22 1220)    ED Course/ Medical Decision Making/ A&P Clinical Course as of 09/19/22 1258  Tue Sep 19, 2022  0931 Reviewed possibility of imaging test.  Patient is concerned about the radiation from a CT scan asked about MRI testing.  Explained is not available at this facility and that he has not typically done for an evaluation of acute abdominal pain.  Explained to her that overall the radiation exposure is of low risk [JK]  1001 CBC(!) Blood cell count elevated [JK]  1035 Recommended proceeding with CT scan with the patient's elevated white blood  cell count and blood in her stool. [JK]  1052 Patient would like to proceed with CT scan. [JK]  1159 CT scan shows evidence of inflammatory or infectious colitis.  No evidence of abscess or obstruction [JK]    Clinical Course User Index [JK] Linwood Dibbles, MD                             Medical Decision Making Parental diagnosis includes but not limited to gastroenteritis, colitis, diverticulitis,  Problems Addressed: Colitis: acute illness or injury that poses a threat to life or bodily functions  Amount and/or Complexity of Data Reviewed Labs: ordered. Decision-making details documented in ED Course. Radiology: ordered.  Risk Prescription drug management.   Presented ED for evaluation of abdominal pain and bloody diarrhea.  Patient's ED workup notable for leukocytosis but no signs of anemia.  Lipase and her metabolic panel is normal.  Patient is afebrile nontoxic but she did have abdominal discomfort on exam.  With her rectal bleeding and abdominal pain CT scan was performed.  Does show evidence of colitis but no signs of diverticulitis or mass.  Patient's not having any signs of severe blood loss.  She has not had any large-volume bloody stools here in the ED.  It is reasonable to try course of outpatient antibiotics and close follow-up.  Patient is comfortable with this plan.  She understands return to the emergency room if she starts having severe pain in, increasing pain lightheadedness dizziness.  Will have her plan on close outpatient follow-up with her primary care doctor and GI.        Final Clinical Impression(s) / ED Diagnoses Final diagnoses:  Colitis    Rx / DC Orders ED Discharge Orders          Ordered    ciprofloxacin (CIPRO) 500 MG tablet  2 times daily        09/19/22 1226    metroNIDAZOLE (FLAGYL) 500 MG tablet  2 times daily        09/19/22 1226    ondansetron (ZOFRAN-ODT) 8 MG disintegrating tablet  Every 8 hours PRN        09/19/22 1226               Linwood Dibbles, MD 09/19/22 1258

## 2022-09-19 NOTE — Discharge Instructions (Addendum)
Take the antibiotics until they are finished.  You can take the Zofran as needed for nausea and vomiting.  Follow-up with your primary care doctor and consider seeing a GI doctor to be rechecked.  Return to [facility] or [facility] for high fevers, increasing pain, increasing blood in your stool.

## 2022-09-19 NOTE — ED Triage Notes (Signed)
Pt arrives to ED with c/o diarrhea and emesis that started last night. Symptoms associated with abdominal cramping and and episode of bloody diarrhea.

## 2022-11-13 ENCOUNTER — Other Ambulatory Visit (HOSPITAL_BASED_OUTPATIENT_CLINIC_OR_DEPARTMENT_OTHER): Payer: Self-pay | Admitting: Family

## 2022-11-13 DIAGNOSIS — E785 Hyperlipidemia, unspecified: Secondary | ICD-10-CM

## 2022-11-13 DIAGNOSIS — I700 Atherosclerosis of aorta: Secondary | ICD-10-CM

## 2022-11-20 ENCOUNTER — Ambulatory Visit
Admission: RE | Admit: 2022-11-20 | Discharge: 2022-11-20 | Disposition: A | Discharge status: Home / Self Care | Payer: Medicare Other | Source: Ambulatory Visit

## 2022-11-20 DIAGNOSIS — Z1231 Encounter for screening mammogram for malignant neoplasm of breast: Secondary | ICD-10-CM

## 2022-12-14 ENCOUNTER — Encounter: Payer: Self-pay | Admitting: Podiatry

## 2022-12-14 ENCOUNTER — Ambulatory Visit: Payer: Medicare Other | Admitting: Podiatry

## 2022-12-14 ENCOUNTER — Encounter (HOSPITAL_BASED_OUTPATIENT_CLINIC_OR_DEPARTMENT_OTHER): Payer: Self-pay | Admitting: Cardiovascular Disease

## 2022-12-14 DIAGNOSIS — M7742 Metatarsalgia, left foot: Secondary | ICD-10-CM | POA: Diagnosis not present

## 2022-12-14 DIAGNOSIS — M7741 Metatarsalgia, right foot: Secondary | ICD-10-CM

## 2022-12-14 DIAGNOSIS — L84 Corns and callosities: Secondary | ICD-10-CM

## 2022-12-14 DIAGNOSIS — L909 Atrophic disorder of skin, unspecified: Secondary | ICD-10-CM | POA: Diagnosis not present

## 2022-12-14 NOTE — Progress Notes (Signed)
  Subjective:  Patient ID: Diana [name], female    DOB: [birth year],  MRN: [id number]  Chief Complaint  Patient presents with   Callouses    RM1: patient is here forbilateral foot callouses    67 y.o. female presents with the above complaint. History confirmed with patient. Calluses are very painful and started become limiting in her activity and walking  Objective:  Physical Exam: warm, good capillary refill, no trophic changes or ulcerative lesions, normal DP and PT pulses, normal sensory exam, and bilateral prominent fifth metatarsal heads, atrophy of the plantar fat pad on the forefoot, callus submetatarsal 5 bilateral and plantar heel right.  Assessment:   1. Callus of foot   2. Fat pad atrophy of foot   3. Metatarsalgia of both feet      Plan:  Patient was evaluated and treated and all questions answered.  All symptomatic hyperkeratoses were safely debrided as a courtesy today with a sterile #15 blade to patient's level of comfort without incident. We discussed preventative and palliative care of these lesions including supportive and accommodative shoegear, padding, prefabricated and custom molded accommodative orthoses, use of a pumice stone and lotions/creams daily.  I recommended using urea 40% cream.  I do think she may benefit from a custom molded accommodative orthosis as well, she says she has not had good success with these in the past and would like to hold off on this for now.   Return if symptoms worsen or fail to improve.

## 2022-12-14 NOTE — Patient Instructions (Signed)
Look for urea 40% cream or ointment and apply to the thickened dry skin / calluses. This can be bought over the counter, at a pharmacy or online such as [employer].  

## 2022-12-19 ENCOUNTER — Encounter (HOSPITAL_BASED_OUTPATIENT_CLINIC_OR_DEPARTMENT_OTHER): Payer: Self-pay | Admitting: Cardiovascular Disease

## 2022-12-19 LAB — COMPREHENSIVE METABOLIC PANEL
Albumin: 4.4 g/dL (ref 3.9–4.9)
Alkaline Phosphatase: 89 IU/L (ref 44–121)
Total Protein: 6.5 g/dL (ref 6.0–8.5)

## 2022-12-19 LAB — LIPID PANEL: Triglycerides: 115 mg/dL (ref 0–149)

## 2023-01-04 ENCOUNTER — Telehealth (HOSPITAL_BASED_OUTPATIENT_CLINIC_OR_DEPARTMENT_OTHER): Payer: Self-pay

## 2023-01-04 DIAGNOSIS — I700 Atherosclerosis of aorta: Secondary | ICD-10-CM

## 2023-01-04 DIAGNOSIS — E785 Hyperlipidemia, unspecified: Secondary | ICD-10-CM

## 2023-01-04 MED ORDER — SIMVASTATIN 20 MG PO TABS
20.0000 mg | ORAL_TABLET | Freq: Every day | ORAL | 3 refills | Status: DC
Start: 2023-01-04 — End: 2023-03-30

## 2023-01-04 NOTE — Telephone Encounter (Addendum)
Seen by patient Franchot Heidelberg on 01/04/2023 10:17 AM; follow up Sherlon Handing sent to patient    ----- Message from Alver Sorrow sent at 01/04/2023  9:02 AM EDT ----- Normal kidneys, liver, electrolytes. Total cholesterol triglycerides, HDL are all at goal.  LDL (bad cholesterol) of 84 with goal being less than 70.  If she is willing, recommend increasing Simvastatin to 20 mg daily with repeat FLP/LFT in 2-3 months. If not willing to increase daily dosing, may increased to 10mg  and 20mg  every other day.

## 2023-01-04 NOTE — Addendum Note (Signed)
Addended by: Marlene Lard on: 01/04/2023 02:06 PM   Modules accepted: Orders

## 2023-02-25 IMAGING — MG MM DIGITAL SCREENING BILAT W/ TOMO AND CAD
8 series · 8 of 24 positions shown · non-contrast
Comparison: Previous exam(s).

CLINICAL DATA: Screening.

EXAM:
DIGITAL SCREENING BILATERAL MAMMOGRAM WITH TOMOSYNTHESIS AND CAD
TECHNIQUE: Bilateral screening digital craniocaudal and mediolateral oblique
mammograms were obtained. Bilateral screening digital breast
tomosynthesis was performed. The images were evaluated with
computer-aided detection.

[R CC synth-2D]
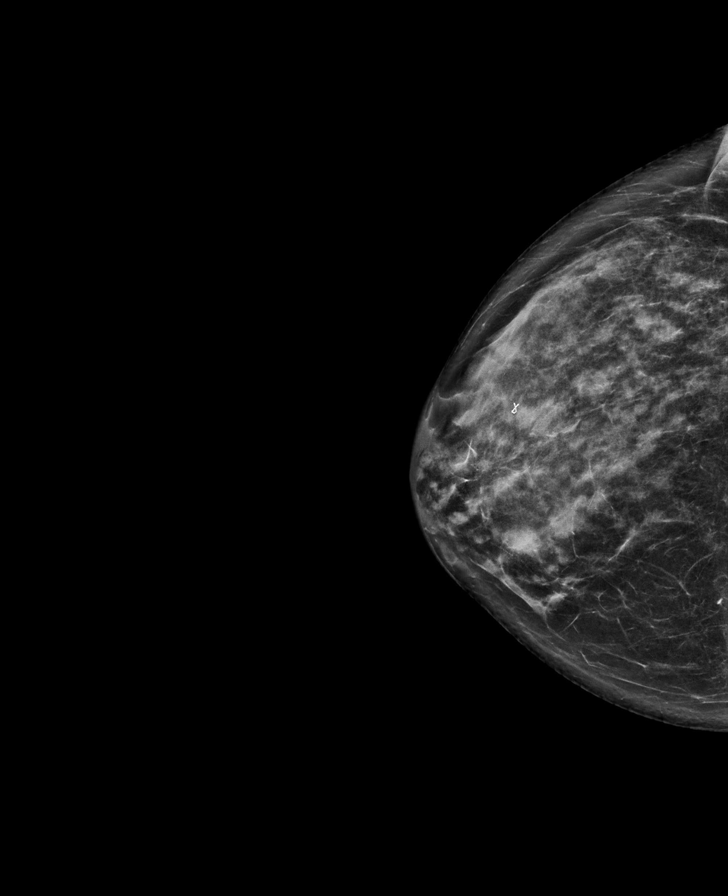

[R MLO synth-2D]
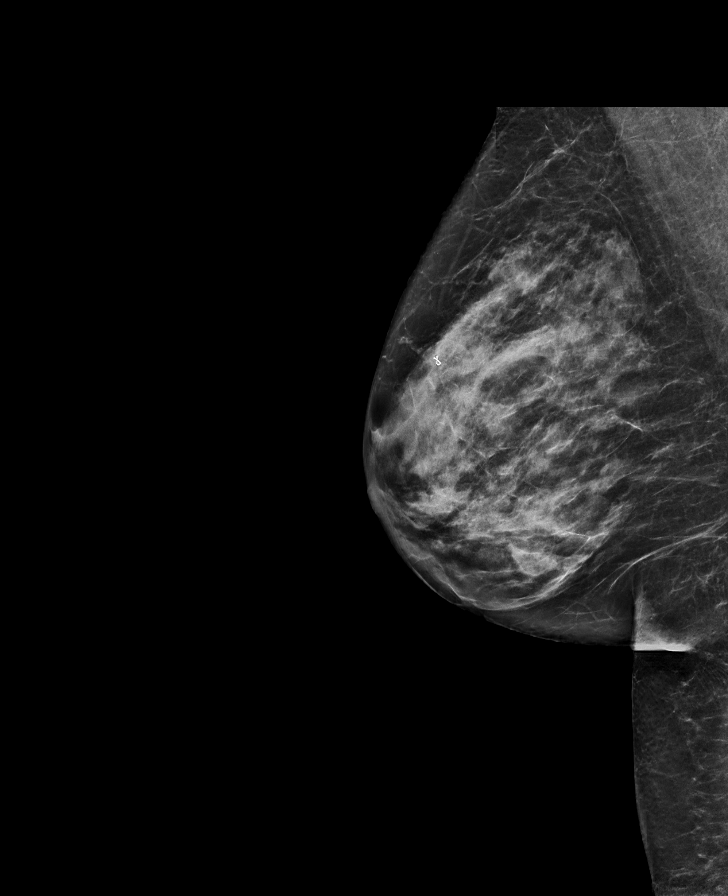

[L MLO synth-2D]
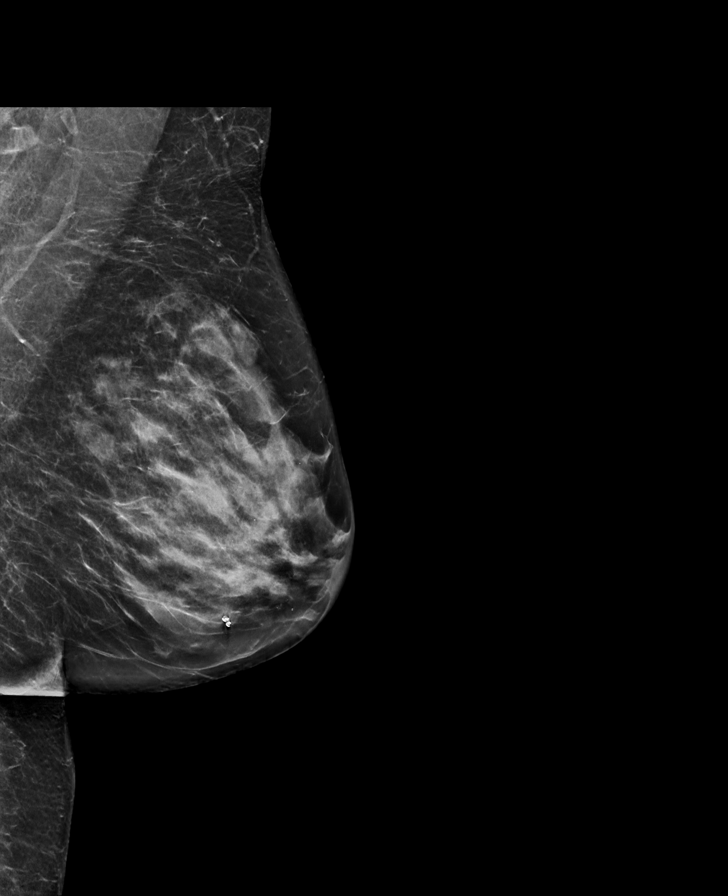

[L CC synth-2D]
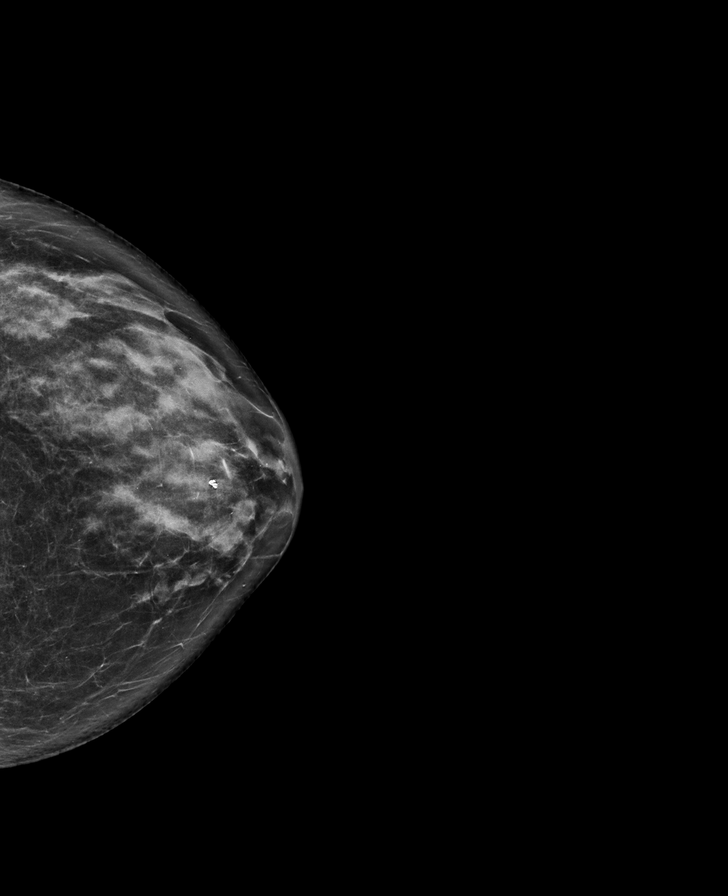

[R CC tomo · tomo slice 37/72.0]
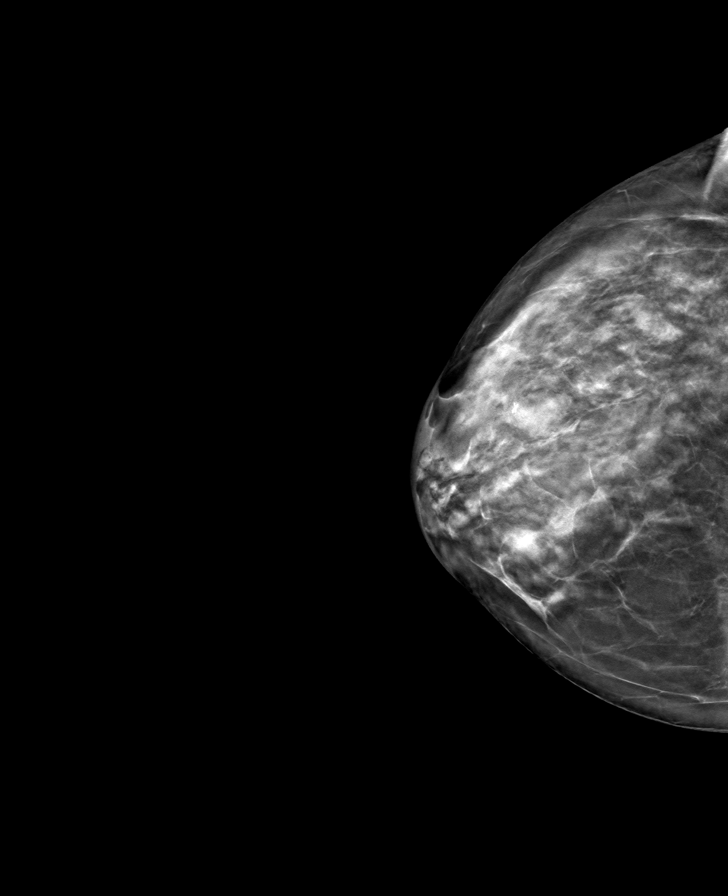

[L MLO tomo · tomo slice 38/75.0]
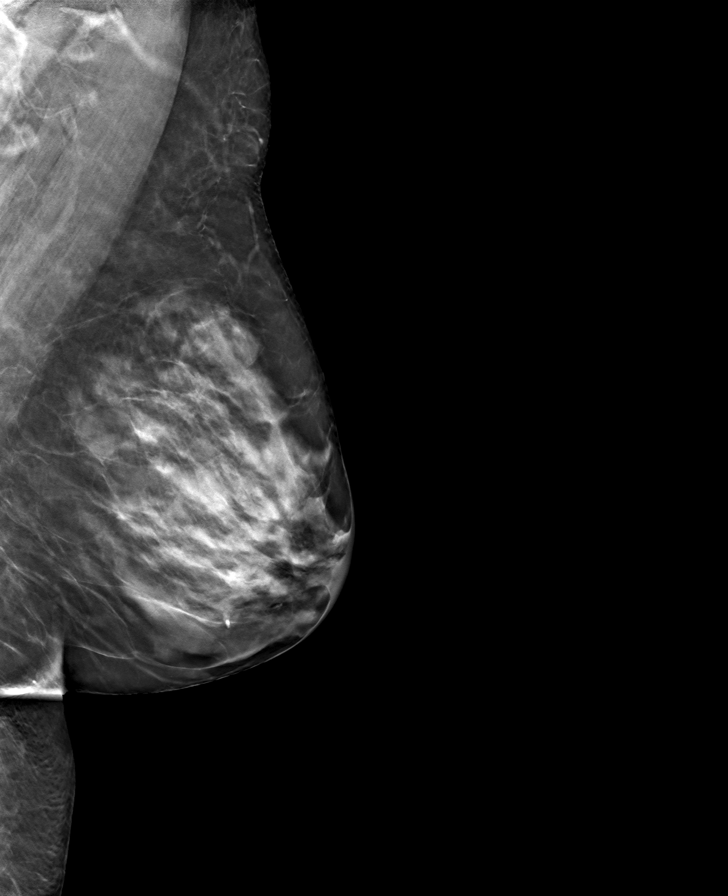

[L CC tomo · tomo slice 36/71.0]
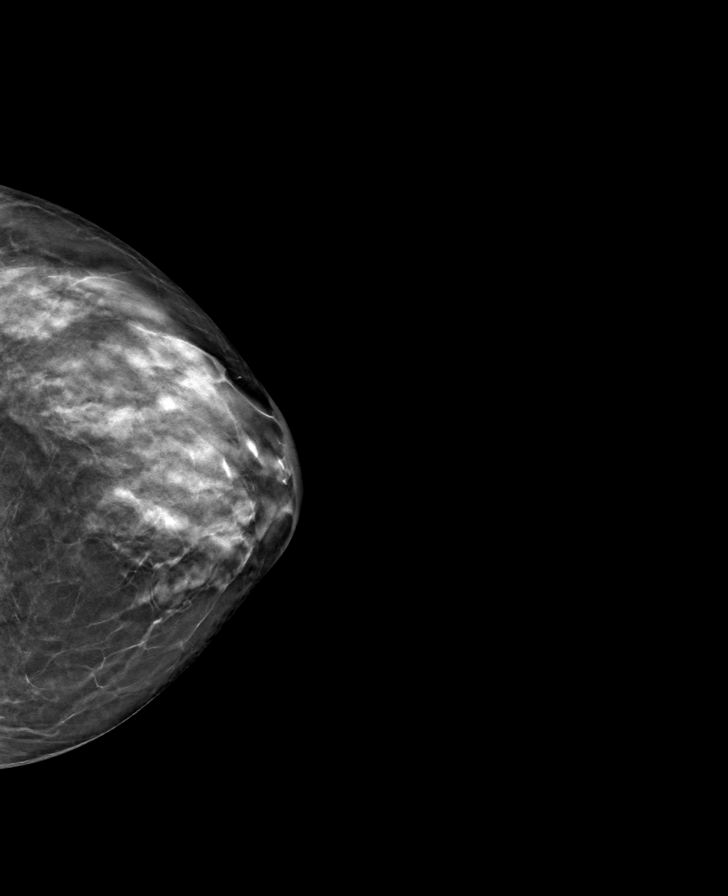

[R MLO tomo · tomo slice 37/73.0]
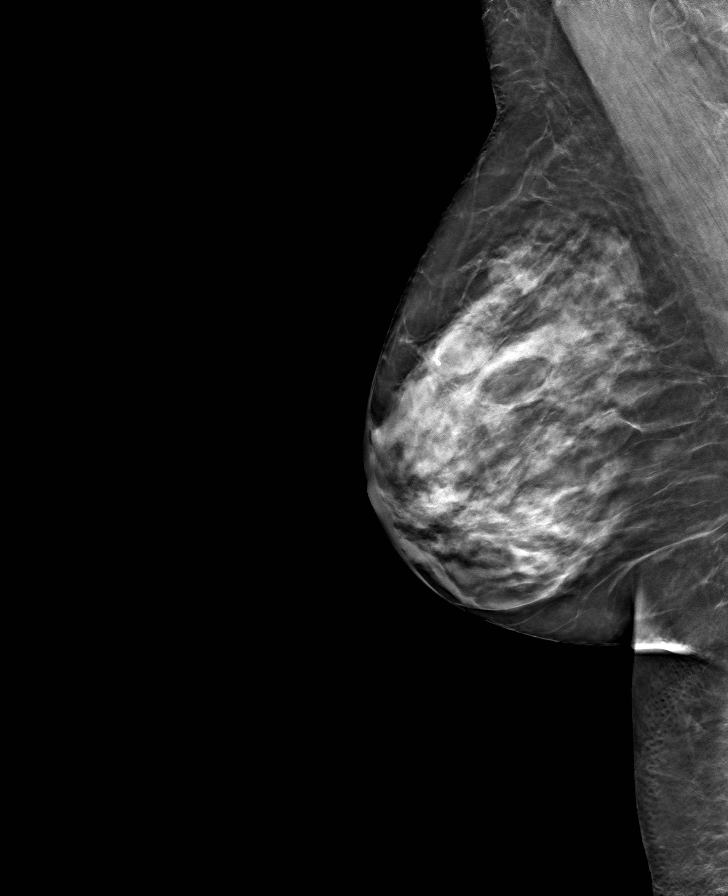

[8 of 24 positions shown; findings below may reference images not displayed]

ACR Breast Density Category c: The breast tissue is heterogeneously
dense, which may obscure small masses.
FINDINGS: There are no findings suspicious for malignancy.
IMPRESSION: No mammographic evidence of malignancy. A result letter of this
screening mammogram will be mailed directly to the patient.

RECOMMENDATION:
Screening mammogram in one year. (Code:Q3-W-BC3)

BI-RADS CATEGORY  1: Negative.

## 2023-03-30 ENCOUNTER — Encounter (HOSPITAL_BASED_OUTPATIENT_CLINIC_OR_DEPARTMENT_OTHER): Payer: Self-pay | Admitting: Cardiovascular Disease

## 2023-03-30 DIAGNOSIS — E785 Hyperlipidemia, unspecified: Secondary | ICD-10-CM

## 2023-03-30 DIAGNOSIS — I700 Atherosclerosis of aorta: Secondary | ICD-10-CM

## 2023-03-30 LAB — HEPATIC FUNCTION PANEL
ALT: 12 [IU]/L (ref 0–32)
AST: 20 [IU]/L (ref 0–40)
Albumin: 4.2 g/dL (ref 3.9–4.9)
Alkaline Phosphatase: 81 [IU]/L (ref 44–121)
Bilirubin Total: 0.6 mg/dL (ref 0.0–1.2)
Bilirubin, Direct: 0.19 mg/dL (ref 0.00–0.40)
Total Protein: 6.2 g/dL (ref 6.0–8.5)

## 2023-03-30 LAB — LIPID PANEL
Chol/HDL Ratio: 2.6 {ratio} (ref 0.0–4.4)
Cholesterol, Total: 136 mg/dL (ref 100–199)
HDL: 52 mg/dL (ref >39)
LDL Chol Calc (NIH): 68 mg/dL (ref 0–99)
Triglycerides: 80 mg/dL (ref 0–149)
VLDL Cholesterol Cal: 16 mg/dL (ref 5–40)

## 2023-03-30 MED ORDER — SIMVASTATIN 20 MG PO TABS
ORAL_TABLET | ORAL | 3 refills | Status: AC
Start: 2023-03-30 — End: ?

## 2023-10-02 ENCOUNTER — Other Ambulatory Visit: Payer: Self-pay | Admitting: Family

## 2023-10-02 ENCOUNTER — Other Ambulatory Visit (HOSPITAL_COMMUNITY): Payer: Self-pay | Admitting: Family Medicine

## 2023-10-03 LAB — HEPATIC FUNCTION PANEL
ALT: 14 IU/L (ref 0–32)
AST: 21 IU/L (ref 0–40)
Albumin: 4.3 g/dL (ref 3.9–4.9)
Alkaline Phosphatase: 79 IU/L (ref 44–121)
Bilirubin Total: 0.5 mg/dL (ref 0.0–1.2)
Bilirubin, Direct: 0.19 mg/dL (ref 0.00–0.40)
Total Protein: 6.3 g/dL (ref 6.0–8.5)

## 2023-10-03 LAB — LIPID PANEL
Chol/HDL Ratio: 1.7 ratio (ref 0.0–4.4)
Cholesterol, Total: 154 mg/dL (ref 100–199)
HDL: 93 mg/dL (ref >39)
LDL Chol Calc (NIH): 45 mg/dL (ref 0–99)
Triglycerides: 87 mg/dL (ref 0–149)
VLDL Cholesterol Cal: 16 mg/dL (ref 5–40)

## 2023-10-04 ENCOUNTER — Ambulatory Visit (HOSPITAL_BASED_OUTPATIENT_CLINIC_OR_DEPARTMENT_OTHER): Payer: Medicare Other | Admitting: Cardiovascular Disease

## 2023-10-04 ENCOUNTER — Encounter (HOSPITAL_BASED_OUTPATIENT_CLINIC_OR_DEPARTMENT_OTHER): Payer: Self-pay | Admitting: Cardiovascular Disease

## 2023-10-04 ENCOUNTER — Ambulatory Visit (HOSPITAL_BASED_OUTPATIENT_CLINIC_OR_DEPARTMENT_OTHER): Payer: Self-pay | Admitting: Family

## 2023-10-04 VITALS — BP 112/68 | HR 52 | Ht 61.0 in | Wt 122.1 lb

## 2023-10-04 DIAGNOSIS — I341 Nonrheumatic mitral (valve) prolapse: Secondary | ICD-10-CM

## 2023-10-04 DIAGNOSIS — I700 Atherosclerosis of aorta: Secondary | ICD-10-CM | POA: Diagnosis not present

## 2023-10-04 DIAGNOSIS — Z8249 Family history of ischemic heart disease and other diseases of the circulatory system: Secondary | ICD-10-CM | POA: Diagnosis not present

## 2023-10-04 NOTE — Progress Notes (Signed)
 Cardiology Office Note:  .   Date:  10/04/2023  ID:  Diana [name], DOB Jan 15, 1956, MRN [id number] PCP: Helyn Lobstein, MD  [facility] Providers Cardiologist:  Maudine Sos, MD    History of Present Illness: .    Diana [name] is a 68 y.o. 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 She was initially seen 12/28/2021 for history of mitral valve prolapse and family history of MI, hypertension, and Afib. Referral notes from Dr. Alphonzo Jenkins personally reviewed. At her visit with Dr. Alphonzo Jenkins 11/25/2021 it was noted that she had been diagnosed with mitral valve prolapse approximately 20 years prior in the setting of mild chest pain. She also reported dyspnea with walking up stairs or a hill. Due to her diagnosis of MVP and significant family history of MI, HTN, and Afib, she wished to establish routine care with cardiology.   At her initial visit, her blood pressure was high in the office but had been controlled at home. She was doing well with healthy diet and regular exercise. She had a coronary CT 12/2021 revealing a coronary calcium score of 0 and aortic atherosclerosis. Her echocardiogram showed LVEF 60-65%, grade 1 diastolic dysfunction, and borderline bileaflet mitral valve prolapse. There was mild mitral valve regurgitation and no evidence of mitral stenosis. She followed up with Neomi Banks, NP 01/2022 and was started on 10 mg simvastatin  three times per week given presence of aortic atherosclerosis. As of 04/2022 her LDL had improved from 127 down to 89. Recommended to increase simvastatin  to 10 mg daily. Within the next 2 weeks she developed GI distress and diarrhea; unclear if due to her simvastatin  increase or in the setting of life stressors. To be certain, she was advised to complete a 2 week statin holiday.  Repeat lipids 08/2022 showed her LDL improved to 78, normal liver enzymes. It was noted that she had been taking 10 mg simvastatin  3x a week, and taking a half tablet (5 mg) on the other days. She hadn't noticed any recurring side effects and agreed to resume 1 tablet (10 mg) daily.   At her visit 08/2022 she had started back taking simvastatin  daily.  Labs were excellent on repeat 09/2023.  Discussed the use of AI scribe software for clinical note transcription with the patient, who gave verbal consent to proceed.  History of Present Illness  Diana [name] is experiencing grief following the loss of her 54 year old dog, whom she considered her 'child'. This has impacted her exercise routine, as she was the dog's constant caregiver and has not been exercising much in the past three months. She is now attempting to resume walking as a form of exercise.  She experienced a brief episode of chest pain while crying, which she associates with emotional distress. No ongoing chest pain, shortness of breath, or leg swelling is present.  Her cholesterol levels were rechecked recently. She is on simvastatin , alternating 20 mg one day and 10 mg the next. She has noticed a decrease in her LDL levels, which she attributes to a reduced appetite and weight loss of about six pounds over the past three months, likely due to stress and grief.  She experiences nighttime pain in her left knee and right hip, which does not bother her during the day. She attributes this to her sleeping position and notes that she is a light sleeper.  She has a  history of mitral valve prolapse with minimal leakage, which is being monitored.  ROS:  As per HPI  Studies Reviewed: .       Echo 01/12/22:  1. Left ventricular ejection fraction, by estimation, is 60 to 65%. The  left ventricle has normal function. The left ventricle has no regional  wall motion abnormalities. Left ventricular diastolic parameters are  consistent with Grade I  diastolic  dysfunction (impaired relaxation).   2. Right ventricular systolic function is normal. The right ventricular  size is normal. Tricuspid regurgitation signal is inadequate for assessing  PA pressure.   3. Borderline bileaflet mitral valve prolapse. The mitral valve is  grossly normal. Mild mitral valve regurgitation. No evidence of mitral  stenosis.   4. The aortic valve is tricuspid. Aortic valve regurgitation is not  visualized. No aortic stenosis is present.   5. The inferior vena cava is normal in size with greater than 50%  respiratory variability, suggesting right atrial pressure of 3 mmHg.   Risk Assessment/Calculations:             Physical Exam:   VS:  BP 112/68 (BP Location: Left Arm, Patient Position: Sitting, Cuff Size: Normal)   Pulse (!) 52   Ht 5' 1 (1.549 m)   Wt 122 lb 1.6 oz (55.4 kg)   SpO2 99%   BMI 23.07 kg/m  , BMI Body mass index is 23.07 kg/m. GENERAL:  Well appearing HEENT: Pupils equal round and reactive, fundi not visualized, oral mucosa unremarkable NECK:  No jugular venous distention, waveform within normal limits, carotid upstroke brisk and symmetric, no bruits, no thyromegaly LUNGS:  Clear to auscultation bilaterally HEART:  RRR.  PMI not displaced or sustained,S1 and S2 within normal limits, no S3, no S4, no clicks, no rubs, no murmurs ABD:  Flat, positive bowel sounds normal in frequency in pitch, no bruits, no rebound, no guarding, no midline pulsatile mass, no hepatomegaly, no splenomegaly EXT:  2 plus pulses throughout, no edema, no cyanosis no clubbing SKIN:  No rashes no nodules NEURO:  Cranial nerves II through XII grossly intact, motor grossly intact throughout PSYCH:  Cognitively intact, oriented to person place and time  ASSESSMENT AND PLAN: .    Assessment & Plan  # Mitral valve prolapse Mitral valve prolapse with minimal prolapse and mild regurgitation. Asymptomatic. EKG unremarkable.  No further evaluation needed unless  she develops symptoms.    # Aortic atherosclerosis: # Hyperlipidemia:  Cholesterol levels well-controlled with simvastatin . Recent LDL 45, lower but not concerning. No statin-related adverse effects. Lower LDL reduces cardiovascular risk without increasing dementia or cancer risk. - Continue simvastatin  regimen of 20 mg one day and 10 mg the next. - If she remains stable at follow up, recommend PRN return.  This can be managed well with primary care.        Dispo: f/u 1 year  Signed, Maudine Sos, MD

## 2023-10-04 NOTE — Patient Instructions (Addendum)
 Medication Instructions:   Your physician recommends that you continue on your current medications as directed. Please refer to the Current Medication list given to you today.   *If you need a refill on your cardiac medications before your next appointment, please call your pharmacy*  Lab Work:  Your physician recommends that you return for a FASTING lipid profile/cmet 1 year prior to your appointment. Reminder placed in system.    If you have labs (blood work) drawn today and your tests are completely normal, you will receive your results only by: MyChart Message (if you have MyChart) OR A paper copy in the mail If you have any lab test that is abnormal or we need to change your treatment, we will call you to review the results.  Testing/Procedures:  None ordered.  Follow-Up: At [facility], you and your health needs are our priority.  As part of our continuing mission to provide you with exceptional heart care, our providers are all part of one team.  This team includes your primary Cardiologist (physician) and Advanced Practice Providers or APPs (Physician Assistants and Nurse Practitioners) who all work together to provide you with the care you need, when you need it.  Your next appointment:   1 year(s)  Provider:   Slater Duncan, NP    We recommend signing up for the patient portal called MyChart.  Sign up information is provided on this After Visit Summary.  MyChart is used to connect with patients for Virtual Visits (Telemedicine).  Patients are able to view lab/test results, encounter notes, upcoming appointments, etc.  Non-urgent messages can be sent to your provider as well.   To learn more about what you can do with MyChart, go to [email or web address].   Other Instructions  Your physician wants you to follow-up in: 1 year.  You will receive a reminder letter in the mail two months in advance. If you don't receive a letter, please call our office to  schedule the follow-up appointment.

## 2024-01-09 ENCOUNTER — Other Ambulatory Visit: Payer: Self-pay | Admitting: Family Medicine

## 2024-01-09 DIAGNOSIS — Z1231 Encounter for screening mammogram for malignant neoplasm of breast: Secondary | ICD-10-CM

## 2024-02-11 ENCOUNTER — Ambulatory Visit
Admission: RE | Admit: 2024-02-11 | Discharge: 2024-02-11 | Disposition: A | Discharge status: Home / Self Care | Source: Ambulatory Visit

## 2024-02-11 DIAGNOSIS — Z1231 Encounter for screening mammogram for malignant neoplasm of breast: Secondary | ICD-10-CM

## 2024-03-24 ENCOUNTER — Encounter (HOSPITAL_BASED_OUTPATIENT_CLINIC_OR_DEPARTMENT_OTHER): Payer: Self-pay | Admitting: Cardiovascular Disease
